# Patient Record
Sex: Female | Born: 2000 | Race: Black or African American | Hispanic: No | Marital: Single | Smoking: Former smoker
Health system: Southern US, Community
[De-identification: ages and names within clinical notes are randomized; demographics above are authoritative.]

## PROBLEM LIST (undated history)

## (undated) DIAGNOSIS — M93003 Unspecified slipped upper femoral epiphysis (nontraumatic), unspecified hip: Secondary | ICD-10-CM

## (undated) DIAGNOSIS — E669 Obesity, unspecified: Secondary | ICD-10-CM

## (undated) DIAGNOSIS — R7303 Prediabetes: Secondary | ICD-10-CM

## (undated) DIAGNOSIS — F819 Developmental disorder of scholastic skills, unspecified: Secondary | ICD-10-CM

## (undated) DIAGNOSIS — F909 Attention-deficit hyperactivity disorder, unspecified type: Secondary | ICD-10-CM

## (undated) DIAGNOSIS — L709 Acne, unspecified: Secondary | ICD-10-CM

## (undated) DIAGNOSIS — H1010 Acute atopic conjunctivitis, unspecified eye: Secondary | ICD-10-CM

## (undated) DIAGNOSIS — J309 Allergic rhinitis, unspecified: Secondary | ICD-10-CM

## (undated) HISTORY — DX: Obesity, unspecified: E66.9

## (undated) HISTORY — DX: Acute atopic conjunctivitis, unspecified eye: H10.10

## (undated) HISTORY — DX: Acne, unspecified: L70.9

## (undated) HISTORY — DX: Unspecified slipped upper femoral epiphysis (nontraumatic), unspecified hip: M93.003

## (undated) HISTORY — DX: Prediabetes: R73.03

## (undated) HISTORY — DX: Allergic rhinitis, unspecified: J30.9

## (undated) HISTORY — DX: Developmental disorder of scholastic skills, unspecified: F81.9

## (undated) HISTORY — DX: Attention-deficit hyperactivity disorder, unspecified type: F90.9

---
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
## 2019-01-05 ENCOUNTER — Telehealth: Payer: Self-pay | Admitting: Pediatrics

## 2019-01-05 NOTE — Telephone Encounter (Signed)

## 2019-01-06 ENCOUNTER — Ambulatory Visit: Payer: No Typology Code available for payment source | Admitting: Family

## 2019-01-13 ENCOUNTER — Ambulatory Visit: Payer: No Typology Code available for payment source | Admitting: Family

## 2019-01-19 ENCOUNTER — Telehealth: Payer: Self-pay | Admitting: Pediatrics

## 2019-01-19 NOTE — Telephone Encounter (Signed)

## 2019-01-20 ENCOUNTER — Encounter: Payer: Self-pay | Admitting: Family

## 2019-01-20 ENCOUNTER — Other Ambulatory Visit (HOSPITAL_COMMUNITY)
Admission: RE | Admit: 2019-01-20 | Discharge: 2019-01-20 | Disposition: A | Discharge status: Home / Self Care | Payer: No Typology Code available for payment source | Source: Ambulatory Visit | Attending: Family | Admitting: Family

## 2019-01-20 ENCOUNTER — Other Ambulatory Visit: Payer: Self-pay

## 2019-01-20 ENCOUNTER — Ambulatory Visit (INDEPENDENT_AMBULATORY_CARE_PROVIDER_SITE_OTHER): Payer: No Typology Code available for payment source | Admitting: Family

## 2019-01-20 VITALS — BP 114/77 | HR 77 | Ht 69.0 in | Wt 137.2 lb

## 2019-01-20 DIAGNOSIS — Z3046 Encounter for surveillance of implantable subdermal contraceptive: Secondary | ICD-10-CM

## 2019-01-20 DIAGNOSIS — Z113 Encounter for screening for infections with a predominantly sexual mode of transmission: Secondary | ICD-10-CM

## 2019-01-20 NOTE — Patient Instructions (Signed)
Schedule one month appointment before you leave clinic today.  Your Nexplanon was removed today and is no longer preventing pregnancy.  If you have sex, remember to use condoms to prevent pregnancy and to prevent sexually transmitted infections.  Leave the outside bandage on for 24 hours.  Leave the smaller bandages on for 3-5 days or until they fall off on their own.  Keep the area clean and dry for 3-5 days.  There is usually bruising or swelling at and around the removal site for a few days to a week after the removal.  If you see redness or pus draining from the removal site, call us immediately.  We would like you to return to the clinic for a follow-up visit in 1 month.  You can call [facility] 24 hours a day with any questions or concerns.  There is always a nurse or doctor available to take your call.  Call 9-1-1 if you have a life-threatening emergency.  For anything else, please call us at [phone] before heading to the ER.

## 2019-01-21 LAB — URINE CYTOLOGY ANCILLARY ONLY
Chlamydia: NEGATIVE
Comment: NEGATIVE
Comment: NEGATIVE
Comment: NORMAL
Neisseria Gonorrhea: NEGATIVE
Trichomonas: NEGATIVE

## 2019-01-29 NOTE — Progress Notes (Signed)
History was provided by the patient.  Angie Lopez is a 18 y.o. female who is here for nexplanon removal.   PCP confirmed? Yes.    Roselind Messier, MD  HPI:   -08/08/2015 nexplanon insertion  -her second implant -had breakthrough bleeding with this one  -elects removal today -declines other methods; denies pregnancy intent  -denies pain with intercourse, pelvic or abdominal pain  -no recent changes in discharge, no lesions   Review of Systems  Constitutional: Negative for chills, fever and malaise/fatigue.  HENT: Negative for sore throat.   Respiratory: Negative for cough and shortness of breath.   Cardiovascular: Negative for chest pain and palpitations.  Gastrointestinal: Negative for abdominal pain.  Genitourinary: Negative for dysuria and frequency.  Musculoskeletal: Negative for joint pain and myalgias.  Skin: Negative for rash.  Psychiatric/Behavioral: The patient is not nervous/anxious.      Patient Active Problem List   Diagnosis Date Noted  . Delusional disorder ([place]) 11/19/2018  . Psychosis ([place]) 11/18/2018  . Failed hearing screening 05/27/2016  . Failed vision screen 05/27/2016  . Adjustment disorder with depressed mood 09/01/2014  . Behavior concern 10/29/2012  . Presence of subdermal contraceptive device 09/22/2012    Current Outpatient Medications on File Prior to Visit  Medication Sig Dispense Refill  . etonogestrel (NEXPLANON) 68 MG IMPL implant Inject 1 each (68 mg total) into the skin once. 1 each 0  . FLUoxetine (PROZAC) 20 MG capsule Take 1 capsule (20 mg total) by mouth daily. (Patient not taking: Reported on 11/17/2018) 30 capsule 3  . Lumateperone Tosylate (CAPLYTA) 42 MG CAPS Take 42 mg by mouth daily. (Patient not taking: Reported on 01/20/2019) 30 capsule 11  . omega-3 acid ethyl esters (LOVAZA) 1 g capsule Take 1 capsule (1 g total) by mouth 2 (two) times daily. (Patient not taking: Reported on 01/20/2019) 60 capsule 5  . Prenatal Vit-Fe  Fumarate-FA (PRENATAL MULTIVITAMIN) TABS tablet Take 1 tablet by mouth daily at 12 noon. (Patient not taking: Reported on 01/20/2019) 90 tablet 1  . temazepam (RESTORIL) 30 MG capsule Take 1 capsule (30 mg total) by mouth at bedtime. 30 capsule 0   No current facility-administered medications on file prior to visit.     No Known Allergies  Physical Exam:    Vitals:   01/20/19 1118  BP: 114/77  Pulse: 77  Weight: 137 lb 3.2 oz (62.2 kg)  Height: 5\' 9"  (1.753 m)    Blood pressure percentiles are not available for patients who are 18 years or older. Patient's last menstrual period was 09/08/2018 (approximate).  Physical Exam HENT:     Head: Normocephalic.  Eyes:     Extraocular Movements: Extraocular movements intact.     Pupils: Pupils are equal, round, and reactive to light.  Cardiovascular:     Rate and Rhythm: Normal rate.  Pulmonary:     Effort: Pulmonary effort is normal.  Musculoskeletal: Normal range of motion.        General: No swelling.  Skin:    General: Skin is warm and dry.     Findings: No rash.  Neurological:     General: No focal deficit present.     Mental Status: She is alert.    Assessment/Plan:  1. Encounter for Nexplanon removal -breakthrough bleeding discussed; she does not want to wait for infection r/o  -of note, prolactin level 44 on 11/19/2018;  Risks & benefits of Nexplanon removal discussed. Consent form signed.  The patient denies any allergies to anesthetics  or antiseptics.  Procedure: Pt was placed in supine position. left arm was flexed at the elbow and externally rotated so that her wrist was parallel to her ear, The device was palpated and marked. The site was cleaned with Betadine. The area surrounding the device was covered with a sterile drape. 1% lidocaine was injected just under the device. A scalpel was used to create a small incision. The device was pushed towards the incision. Fibrous tissue surrounding the device was  gradually removed from the device. The device was removed and measured to ensure all 4 cm of device was removed. Steri-strips were used to close the incision. Pressure dressing was applied to the patient.  The patient was instructed to removed the pressure dressing in 24 hrs.  The patient was advised to move slowly from a supine to an upright position  The patient denied any concerns or complaints  The patient was instructed to schedule a follow-up appt in 1 month. The patient will be called in 1 week to address any concerns.  2. Routine screening for STI (sexually transmitted infection)   -discussed possibility of infection re breakthrough bleeding  -screen per protocol  - GC/Chlamydia  Lab - for urine and other sample types

## 2019-05-27 ENCOUNTER — Emergency Department (HOSPITAL_COMMUNITY)
Admission: EM | Admit: 2019-05-27 | Discharge: 2019-05-31 | Disposition: A | Discharge status: Other Acute Inpatient Hospital | Payer: No Typology Code available for payment source | Attending: Emergency Medicine | Admitting: Emergency Medicine

## 2019-05-27 ENCOUNTER — Encounter (HOSPITAL_COMMUNITY): Payer: Self-pay | Admitting: Emergency Medicine

## 2019-05-27 ENCOUNTER — Other Ambulatory Visit: Payer: Self-pay

## 2019-05-27 DIAGNOSIS — R4689 Other symptoms and signs involving appearance and behavior: Secondary | ICD-10-CM

## 2019-05-27 DIAGNOSIS — Z87891 Personal history of nicotine dependence: Secondary | ICD-10-CM | POA: Diagnosis not present

## 2019-05-27 DIAGNOSIS — Z20822 Contact with and (suspected) exposure to covid-19: Secondary | ICD-10-CM | POA: Insufficient documentation

## 2019-05-27 DIAGNOSIS — F3110 Bipolar disorder, current episode manic without psychotic features, unspecified: Secondary | ICD-10-CM | POA: Diagnosis not present

## 2019-05-27 DIAGNOSIS — F319 Bipolar disorder, unspecified: Secondary | ICD-10-CM | POA: Insufficient documentation

## 2019-05-27 DIAGNOSIS — F29 Unspecified psychosis not due to a substance or known physiological condition: Secondary | ICD-10-CM

## 2019-05-27 DIAGNOSIS — Z79899 Other long term (current) drug therapy: Secondary | ICD-10-CM | POA: Diagnosis not present

## 2019-05-27 DIAGNOSIS — R456 Violent behavior: Secondary | ICD-10-CM | POA: Diagnosis present

## 2019-05-27 DIAGNOSIS — F22 Delusional disorders: Secondary | ICD-10-CM | POA: Insufficient documentation

## 2019-05-27 DIAGNOSIS — Z008 Encounter for other general examination: Secondary | ICD-10-CM

## 2019-05-27 LAB — COMPREHENSIVE METABOLIC PANEL
ALT: 17 U/L (ref 0–44)
AST: 24 U/L (ref 15–41)
Albumin: 4.3 g/dL (ref 3.5–5.0)
Alkaline Phosphatase: 48 U/L (ref 38–126)
Anion gap: 11 (ref 5–15)
BUN: 11 mg/dL (ref 6–20)
CO2: 21 mmol/L — ABNORMAL LOW (ref 22–32)
Calcium: 9.1 mg/dL (ref 8.9–10.3)
Chloride: 107 mmol/L (ref 98–111)
Creatinine, Ser: 0.88 mg/dL (ref 0.44–1.00)
GFR calc Af Amer: >60 mL/min (ref >60)
GFR calc non Af Amer: >60 mL/min (ref >60)
Glucose, Bld: 88 mg/dL (ref 70–99)
Potassium: 3.4 mmol/L — ABNORMAL LOW (ref 3.5–5.1)
Sodium: 139 mmol/L (ref 135–145)
Total Bilirubin: 1.2 mg/dL (ref 0.3–1.2)
Total Protein: 7 g/dL (ref 6.5–8.1)

## 2019-05-27 LAB — CBC
HCT: 36.1 % (ref 36.0–46.0)
Hemoglobin: 12 g/dL (ref 12.0–15.0)
MCH: 29.1 pg (ref 26.0–34.0)
MCHC: 33.2 g/dL (ref 30.0–36.0)
MCV: 87.4 fL (ref 80.0–100.0)
Platelets: 229 10*3/uL (ref 150–400)
RBC: 4.13 MIL/uL (ref 3.87–5.11)
RDW: 12.8 % (ref 11.5–15.5)
WBC: 6.4 10*3/uL (ref 4.0–10.5)
nRBC: 0 % (ref 0.0–0.2)

## 2019-05-27 LAB — ACETAMINOPHEN LEVEL: Acetaminophen (Tylenol), Serum: <10 ug/mL — ABNORMAL LOW (ref 10–30)

## 2019-05-27 LAB — I-STAT BETA HCG BLOOD, ED (MC, WL, AP ONLY): I-stat hCG, quantitative: <5 m[IU]/mL (ref <5)

## 2019-05-27 LAB — SALICYLATE LEVEL: Salicylate Lvl: <7 mg/dL — ABNORMAL LOW (ref 7.0–30.0)

## 2019-05-27 LAB — ETHANOL: Alcohol, Ethyl (B): <10 mg/dL (ref <10)

## 2019-05-27 MED ORDER — ACETAMINOPHEN 325 MG PO TABS: 650.0000 mg | ORAL_TABLET | ORAL | Status: DC | PRN

## 2019-05-27 NOTE — ED Triage Notes (Signed)
Brought in by [employer] under IVC.  Physically combative with [employer], kicking, saying that there is crack in the air and everybody is going to choke on it. IVC paperwork reports, "Respondent is diagnosed as having psychosis. Is not taking her prescribed medication. Is hostile and aggressive towards others. Is having hallucinations claiming her mother has kidnapped her and she saw her mother kill a child etc. Is smoking marijuana daily. Has been committed before. Is a danger to herself and others."

## 2019-05-27 NOTE — Progress Notes (Signed)
Received Angie Lopez at the change of shift asleep in her bed with the sitter at the bedside. She continued to sleep throughout the evening. She woke up and initially calm, but got very agitated and demanding to go home. She was given a snack per her choice. She was playing her TV loudly at 0100 hrs, demanding to close her door and using profanity. She could not follow this writers directions or the sitter.  Security was called to assist with the explanation and hopeful calm her down. She continued to use profanity with threats. She received Geodon 20 mg IM in her right arm. She was calmed within 30 minutes and drifted off to sleep and slept throughout the morning.

## 2019-05-27 NOTE — ED Provider Notes (Addendum)
[facility]-EMERGENCY DEPT Provider Note   CSN: [id number] Arrival date & time: 05/27/19  1737     History Chief Complaint  Patient presents with  . Medical Clearance    Angie Lopez is a 19 y.o. female with past medical history significant for ADHD, delusional disorder, who presents for evaluation under IVC [employer].  Angie Lopez [employer] patient has been physically aggressive, reaching for things in the air because she states "there is a crack in the air and your gonna choke on it."   Per IVC paperwork  "Respondent is diagnosed as having psychosis. Is not taking her prescribed medication. Is hostile and aggressive towards others. Is having hallucinations claiming her mother has kidnapped her and she saw her mother kill a child etc. Is smoking marijuana daily. Has been committed before. Is a danger to herself and others."   Patient unwilling to answer most questions during exam. States "I dont know what the Fu**." Sitting in bed. Rocking back and forth.    Patient actively picking out her hair on evaluation. Pile of hair on her triage bed.  Under IVC by family  HPI     Past Medical History:  Diagnosis Date  . Acne 08/2011   epiduo 2012  . ADHD (attention deficit hyperactivity disorder) 2009   HTN on stimulants, off meds 03/2012  . Allergic conjunctivitis 2012  . Allergic rhinitis 08/2010  . Learning disorder 08/2010   borderline IQ, had IEP in 2012  . Obesity 08/13/2010  . Prediabetes    HBA1C 6.0 03/2010, never took metromin in 2012-2014  . SCFE (slipped capital femoral epiphysis) 2011    Patient Active Problem List   Diagnosis Date Noted  . Delusional disorder (HCC) 11/19/2018  . Psychosis (HCC) 11/18/2018  . Failed hearing screening 05/27/2016  . Failed vision screen 05/27/2016  . Adjustment disorder with depressed mood 09/01/2014  . Behavior concern 10/29/2012  . Presence of subdermal contraceptive device 09/22/2012    Past Surgical History:  Procedure  Laterality Date  . HIP PINNING  12/2009     OB History   No obstetric history on file.     Family History  Problem Relation Age of Onset  . Obesity Sister   . Obesity Brother   . Hypertension Maternal Grandmother   . Diabetes Maternal Grandmother   . Obesity Maternal Grandmother   . Hypertension Maternal Grandfather   . Obesity Mother   . Diabetes Maternal Aunt   . Obesity Maternal Aunt   . Diabetes Cousin   . Thyroid disease Neg Hx     Social History   Tobacco Use  . Smoking status: Former Smoker    Types: Cigarettes  . Smokeless tobacco: Never Used  Substance Use Topics  . Alcohol use: Not on file  . Drug use: Yes    Types: Marijuana, Cocaine    Home Medications Prior to Admission medications   Medication Sig Start Date End Date Taking? Authorizing Provider  FLUoxetine (PROZAC) 20 MG capsule Take 1 capsule (20 mg total) by mouth daily. Patient not taking: Reported on 11/17/2018 04/15/17   Verneda Skill, FNP  Lumateperone Tosylate (CAPLYTA) 42 MG CAPS Take 42 mg by mouth daily. Patient not taking: Reported on 01/20/2019 11/22/18   Malvin Johns, MD  omega-3 acid ethyl esters (LOVAZA) 1 g capsule Take 1 capsule (1 g total) by mouth 2 (two) times daily. Patient not taking: Reported on 01/20/2019 11/22/18   Malvin Johns, MD  Prenatal Vit-Fe Fumarate-FA (PRENATAL MULTIVITAMIN) TABS  tablet Take 1 tablet by mouth daily at 12 noon. Patient not taking: Reported on 01/20/2019 11/22/18   Johnn Hai, MD  temazepam (RESTORIL) 30 MG capsule Take 1 capsule (30 mg total) by mouth at bedtime. Patient not taking: Reported on 05/27/2019 11/22/18   Johnn Hai, MD    Allergies    Patient has no known allergies.  Review of Systems   Review of Systems  Constitutional: Negative.   HENT: Negative.   Respiratory: Negative.   Cardiovascular: Negative.   Gastrointestinal: Negative.   Genitourinary: Negative.   Musculoskeletal: Negative.   Skin: Negative.   Psychiatric/Behavioral:  Positive for agitation, behavioral problems and hallucinations. The patient is hyperactive.   All other systems reviewed and are negative.   Physical Exam Updated Vital Signs BP 110/67 (BP Location: Left Arm)   Pulse (!) 59   Temp 98.2 F (36.8 C) (Oral)   Resp 16   SpO2 100%   Physical Exam Vitals and nursing note reviewed.  Constitutional:      General: She is not in acute distress.    Appearance: She is well-developed. She is not ill-appearing, toxic-appearing or diaphoretic.  HENT:     Head: Normocephalic and atraumatic.     Nose: Nose normal.     Mouth/Throat:     Mouth: Mucous membranes are moist.  Eyes:     Pupils: Pupils are equal, round, and reactive to light.  Cardiovascular:     Rate and Rhythm: Normal rate.     Pulses: Normal pulses.     Heart sounds: Normal heart sounds.  Pulmonary:     Effort: Pulmonary effort is normal. No respiratory distress.     Breath sounds: Normal breath sounds.  Abdominal:     General: Bowel sounds are normal. There is no distension.  Musculoskeletal:        General: Normal range of motion.     Cervical back: Normal range of motion.  Skin:    General: Skin is warm and dry.  Neurological:     Mental Status: She is alert.     Comments: Ambulatory without difficulty  Psychiatric:     Comments: Refuses to answer SI, HI however admits to auditory hallucinations. Appears withdrawn. Intermittently rocks back and forth in bed. Refuses to answer most questions. Admits "seeing things in the air." Picking out hair on exam.    ED Results / Procedures / Treatments   Labs (all labs ordered are listed, but only abnormal results are displayed) Labs Reviewed  COMPREHENSIVE METABOLIC PANEL - Abnormal; Notable for the following components:      Result Value   Potassium 3.4 (*)    CO2 21 (*)    All other components within normal limits  SALICYLATE LEVEL - Abnormal; Notable for the following components:   Salicylate Lvl <3.9 (*)    All other  components within normal limits  ACETAMINOPHEN LEVEL - Abnormal; Notable for the following components:   Acetaminophen (Tylenol), Serum <10 (*)    All other components within normal limits  RESPIRATORY PANEL BY RT PCR (FLU A&B, COVID)  ETHANOL  CBC  RAPID URINE DRUG SCREEN, HOSP PERFORMED  I-STAT BETA HCG BLOOD, ED ([facility], [facility], [facility] ONLY)    EKG None  Radiology No results found.  Procedures Procedures (including critical care time)  Medications Ordered in ED Medications  acetaminophen (TYLENOL) tablet 650 mg (has no administration in time range)    ED Course  I have reviewed the triage vital signs and the nursing  notes.  Pertinent labs & imaging results that were available during my care of the patient were reviewed by me and considered in my medical decision making (see chart for details).  19 year old presents under IVC from family. Family states patient not compliant with medications. She is physically aggressive with others. Apparently has been hallucinating at home and is physically trying to harm others. According to family patient admits to attempt at self harm.  Plan med clearance labs and TTS consult. Currently under IVC.  Patient medically cleared. Psych hold orders placed. Disposition per TTS    MDM Rules/Calculators/A&P                       Final Clinical Impression(s) / ED Diagnoses Final diagnoses:  Medical clearance for psychiatric admission  Aggressive behavior  Psychosis, unspecified psychosis type [facility])    Rx / DC Orders ED Discharge Orders    None       Crissie Aloi A, PA-C 05/27/19 2035    Avon Mergenthaler A, PA-C 05/27/19 2036    Benjiman Core, MD 05/28/19 [phone]

## 2019-05-28 LAB — RAPID URINE DRUG SCREEN, HOSP PERFORMED
Amphetamines: NOT DETECTED
Barbiturates: NOT DETECTED
Benzodiazepines: NOT DETECTED
Cocaine: NOT DETECTED
Opiates: NOT DETECTED
Tetrahydrocannabinol: POSITIVE — AB

## 2019-05-28 LAB — RESPIRATORY PANEL BY RT PCR (FLU A&B, COVID)
Influenza A by PCR: NEGATIVE
Influenza B by PCR: NEGATIVE
SARS Coronavirus 2 by RT PCR: NEGATIVE

## 2019-05-28 MED ORDER — ZIPRASIDONE MESYLATE 20 MG IM SOLR
20.0000 mg | Freq: Once | INTRAMUSCULAR | Status: AC
  Administered 2019-05-28: 20 mg via INTRAMUSCULAR
  Filled 2019-05-28: qty 20

## 2019-05-28 MED ORDER — FLUOXETINE HCL 20 MG PO CAPS
20.0000 mg | ORAL_CAPSULE | Freq: Every day | ORAL | Status: DC
  Filled 2019-05-28 (×4): qty 1

## 2019-05-28 MED ORDER — STERILE WATER FOR INJECTION IJ SOLN
INTRAMUSCULAR | Status: AC
  Administered 2019-05-28: 02:00:00 10 mL
  Filled 2019-05-28: qty 10

## 2019-05-28 NOTE — ED Notes (Signed)
Pt awake, eating breakfast- encouragement and support provided- not open to communication with this nurse, argumentative. Sitter present. Wil continue to monitor.

## 2019-05-28 NOTE — ED Provider Notes (Addendum)
TTS consult appreciated,patient meets inpatient criteria, but no bed available. They will work on placement.   Dione Booze, MD 05/28/19 0990    Dione Booze, MD 05/28/19 [phone]  Patient now agitated and combative, need sedation for her protection and protection of staff.  She is given a dose of ziprasidone.  Following above-noted treatment, patient is sleeping  CRITICAL CARE Performed by: Dione Booze Total critical care time: 35 minutes Critical care time was exclusive of separately billable procedures and treating other patients. Critical care was necessary to treat or prevent imminent or life-threatening deterioration. Critical care was time spent personally by me on the following activities: development of treatment plan with patient and/or surrogate as well as nursing, discussions with consultants, evaluation of patient's response to treatment, examination of patient, obtaining history from patient or surrogate, ordering and performing treatments and interventions, ordering and review of laboratory studies, ordering and review of radiographic studies, pulse oximetry and re-evaluation of patient's condition.    Dione Booze, MD 05/28/19 [phone]

## 2019-05-28 NOTE — BHH Counselor (Addendum)
Pt's mother [name], [phone]) would like to be notified if the pt is discharged from the ED.   Pt's mother also would like to an update on pt's disposition. Clinician expressed the pt has to consent before information is given. Discussed with Randa Evens, RN.    Redmond Pulling, MS, [facility], [facility] Triage Specialist [phone].

## 2019-05-28 NOTE — Progress Notes (Addendum)
Patient meets criteria for inpatient treatment per Nira Conn, NP. No appropriate beds at [facility] currently. CSW faxed referrals to the following facilities for review:  [facility]   [facility]   [facility]   [facility]   [facility]   [facility]   [facility]   [facility]   [facility]   [facility]   [facility]    [facility]  TTS will continue to seek bed placement.     Ruthann Cancer MSW, [facility] Clincal Social Worker Disposition  [facility] Ph: [phone] Fax: [phone] 05/28/2019 11:07 AM

## 2019-05-28 NOTE — ED Notes (Addendum)
Pt has become very agitated attempted to perform the ekg. Say the she would shoot me in the face and that no one is going to touch her. Will try to calm pt and capture ekg later.

## 2019-05-28 NOTE — Progress Notes (Signed)
Pt denied placement at [facility] due to no current bed availability.   Travon Crochet MSW, LCSWA Clincal Social Worker Disposition  [facility] Ph: [phone] Fax: [phone]  

## 2019-05-28 NOTE — Progress Notes (Signed)
Received Angie Lopez at the change of shift awake in her room watching the cartoons on the TV. She got very upset with the technician who asked her to cooperative with the EKG. She started cursing him and making verbal threats. The sitter remains at the bedside. She refused to take her Prozac, delusional and stated I am not her nurse, nothing is wrong with her and I cannot diagnosis her. She called her father and asked him to come and pick her up, he refused and asked her to stay at the hospital for help.  She is sleeping at 2 hrs interval, woke up and requested a snack. Her request was granted and after 2 hours she drifted back to sleep.

## 2019-05-28 NOTE — Consult Note (Addendum)
Chart reviewed and collateral information received from nursing staff.  Patient is sleep and unable to participate in assessment.  Based on notes, she continues to meet inpatient criteria and SW has already faxed her out for placement.   Recommend EKG for baseline assessment; pending results patient can be restarted on home medications, antipsychotics.   Medications:  Caplyta 42mg  capsules po daily for psychosis Fluoxetine 20mg  po daily for depression

## 2019-05-28 NOTE — ED Provider Notes (Signed)
The patient has been placed in psychiatric observation due to the need to provide a safe environment for the patient while obtaining psychiatric consultation and evaluation, as well as ongoing medical and medication management to treat the patient's condition.  The patient has been placed under full IVC at this time.   Fredie Majano, Barbara Cower, MD 05/28/19 [phone]

## 2019-05-28 NOTE — ED Notes (Signed)
Pt refused to take shower at this time RN notify

## 2019-05-28 NOTE — BH Assessment (Addendum)
Tele Assessment Note   Patient Name: Angie Lopez MRN: [id number] Referring Physician: Nettie Elm, PA-C. Location of Patient: [facility], [phone]. Location of Provider: [place]  Sandeep Iacovelli is an 19 y.o. female, who presents involuntary and unaccompanied to [facility]. Clinician provided her name, position and location to introduce herself to the pt. Clinician asked the pt, "what brought you to the hospital?" Pt reported, "an argument." Pt reported, a stranger was in her house, she asked the stranger to call the police, when the police arrived they brought her to the hospital. Pt reported, the stranger wanted her to hurt them. Clinician asked the pt if she has suicidal thoughts. Pt replied, "no, I got money." Clinician asked the pt if she had homicidal thoughts. Pt replied, "I know myself, I'm not weak, I could of murked them." Clinician asked the pt if she had access to weapons. Pt became upset then asked, "do you, do you, what if a zombie came or someone broke in you house; would you have a weapon?" Pt also asked, "who are you?" Clinician reintroduced herself. Pt reported, "how are you a counselor?" Pt then asked clinician to hang up.   Pt was IVC'd by her mother [name], [phone]). Per IVC paperwork: "Respondent is diagnosed as having psychosis. Is not taking her prescribed medications. Is hostile and aggressive towards others. Is having hallucinations claming herm other had kidnapped her and she saw her mother kill a child etc. Is smoking marijuana daily. 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 Is a danger to herself and others."   Clinician called pt's mother to gather collateral information. Per mother, the pt was discharged from [facility] last year but did not follow up with taking medications. Pt's mother reported, the pt stays in her room all day everyday, she comes out to get food and goes back in her room. Pt's mother reported, the pt does not got outside or have  any friends. Per mother, today before coming home she went to the hair store to buy the pt hair (she like to braid her hair). Pt's mother reported, the pt started working on her hair and she asked the pt if she was okay. Per mother, the pt stood in her doorway and started cursing at her, calling her names. Pt's mother reported, she called the police, when they arrived they brought a therapist with them and recommended she fill out IVC paperwork. Per mother, the pt fought the police and was screaming at the top of her lungs. Pt's mother reported, the pt said she killed her boyfriends grandson, on Thursday she text her father back to back with paranoid delusions.Pt's mother reported, recently she can not calm the pt. Pt's mother reported, she lost the pt's prescription during a move but the pt did not take the samples. Pt's mother reported, last week she called [facility] to scheduled the pt a medication management but was unable to because the pt is over 18. Pt's mother there are kitchen knives in the home. Pt's mother does not feel the pt will be safe if discharged from the ED.   Pt has a previous inpatient admission to [facility] on 11/18/2018-11/22/2018.  Pt presents alert in scrubs with argumentative, aggressive speech. Pt's eye contact was poor. Pt's mood was irritable. Pt's affect was irritable, angry. Pt's judgement was impaired. Pt's concentration, insight and impulse control are poor.   Diagnosis: Delusional disorder [facility])  Past Medical History:  Past Medical History:  Diagnosis Date  .  Acne 08/2011   epiduo 2012  . ADHD (attention deficit hyperactivity disorder) 2009   HTN on stimulants, off meds 03/2012  . Allergic conjunctivitis 2012  . Allergic rhinitis 08/2010  . Learning disorder 08/2010   borderline IQ, had IEP in 2012  . Obesity 08/13/2010  . Prediabetes    HBA1C 6.0 03/2010, never took metromin in 2012-2014  . SCFE (slipped capital femoral epiphysis) 2011    Past Surgical History:   Procedure Laterality Date  . HIP PINNING  12/2009    Family History:  Family History  Problem Relation Age of Onset  . Obesity Sister   . Obesity Brother   . Hypertension Maternal Grandmother   . Diabetes Maternal Grandmother   . Obesity Maternal Grandmother   . Hypertension Maternal Grandfather   . Obesity Mother   . Diabetes Maternal Aunt   . Obesity Maternal Aunt   . Diabetes Cousin   . Thyroid disease Neg Hx     Social History:  reports that she has quit smoking. Her smoking use included cigarettes. She has never used smokeless tobacco. She reports current drug use. Drugs: Marijuana and Cocaine. No history on file for alcohol.  Additional Social History:  Alcohol / Drug Use Pain Medications: See MAR Prescriptions: See MAR Over the Counter: See MAR  CIWA: CIWA-Ar BP: 98/71 Pulse Rate: 60 COWS:    Allergies: No Known Allergies  Home Medications: (Not in a hospital admission)   OB/GYN Status:  No LMP recorded. Patient has had an implant.  General Assessment Data [facility] of [facility]: [facility] TTS Assessment: In system Is this a Tele or Face-to-Face Assessment?: Tele Assessment Is this an Initial Assessment or a Re-assessment for this encounter?: Initial Assessment Patient Accompanied by:: N/A Language Other than English: No Living Arrangements: Other (Comment)(Mother. ) What gender do you identify as?: Female Marital status: Single Living Arrangements: Parent Can pt return to current living arrangement?: (Mom wants the pt to get some help. ) Admission Status: Involuntary Petitioner: Family member(Leslie Robsinson, mother, [phone].) Is patient capable of signing voluntary admission?: No Referral Source: Other([employer]) Insurance type: [facility] [employer].     Crisis Care Plan Living Arrangements: Parent Legal Guardian: Other:(Self. ) Name of Psychiatrist: NA Name of Therapist: NA  Education Status Is patient currently in school?: No Is the patient  employed, unemployed or receiving disability?: Unemployed  Risk to self with the past 6 months Suicidal Ideation: No(Pt denies. ) Has patient been a risk to self within the past 6 months prior to admission? : (UTA) Suicidal Intent: No Has patient had any suicidal intent within the past 6 months prior to admission? : (UTA) Is patient at risk for suicide?: No Suicidal Plan?: No Has patient had any suicidal plan within the past 6 months prior to admission? : No Access to Means: (UTA) What has been your use of drugs/alcohol within the last 12 months?: UDS is pending.  Previous Attempts/Gestures: No(Pt denies. ) How many times?: 0 Other Self Harm Risks: UTA Triggers for Past Attempts: None known Intentional Self Injurious Behavior: (UTA) Family Suicide History: Unable to assess Recent stressful life event(s): (UTA) Persecutory voices/beliefs?: Yes Depression: (UTA) Substance abuse history and/or treatment for substance abuse?: (UTA) Suicide prevention information given to non-admitted patients: Not applicable  Risk to Others within the past 6 months Homicidal Ideation: (UTA) Does patient have any lifetime risk of violence toward others beyond the six months prior to admission? : Yes (comment)(Per mother, pt fought the police tonight. ) Thoughts  of Harm to Others: (UTA) Current Homicidal Intent: (UTA) Current Homicidal Plan: (UTA) Access to Homicidal Means: (UTA) Identified Victim: UTA History of harm to others?: Yes Assessment of Violence: On admission Violent Behavior Description: Per mother, pt fought the police tonight.  Does patient have access to weapons?: (UTA) Criminal Charges Pending?: (UTA) Does patient have a court date: (UTA) Is patient on probation?: (UTA)  Psychosis Hallucinations: (UTA) Delusions: Persecutory  Mental Status Report Appearance/Hygiene: In scrubs Eye Contact: Poor Motor Activity: Unremarkable Speech: Argumentative, Aggressive Level of  Consciousness: Alert Mood: Irritable Affect: Irritable, Angry Anxiety Level: Moderate Thought Processes: Unable to Assess Judgement: Impaired Orientation: Person, Place, Time Obsessive Compulsive Thoughts/Behaviors: Minimal  Cognitive Functioning Concentration: Poor Memory: Recent Impaired Is patient IDD: No Insight: Poor Impulse Control: Poor Appetite: (UTA) Have you had any weight changes? : (UTA) Sleep: Unable to Assess Total Hours of Sleep: (UTA) Vegetative Symptoms: Unable to Assess  ADLScreening [facility] Assessment Services) Patient's cognitive ability adequate to safely complete daily activities?: Yes Patient able to express need for assistance with ADLs?: Yes Independently performs ADLs?: Yes (appropriate for developmental age)  Prior Inpatient Therapy Prior Inpatient Therapy: Yes Prior Therapy Dates: 11/2018 Prior Therapy Facilty/Provider(s): [facility].  Reason for Treatment: Psychosis.   Prior Outpatient Therapy Prior Outpatient Therapy: No Does patient have an ACCT team?: No Does patient have Intensive In-House Services?  : No Does patient have [facility] services? : No Does patient have P4CC services?: No  ADL Screening (condition at time of admission) Patient's cognitive ability adequate to safely complete daily activities?: Yes Is the patient deaf or have difficulty hearing?: No Does the patient have difficulty seeing, even when wearing glasses/contacts?: No Does the patient have difficulty concentrating, remembering, or making decisions?: Yes Patient able to express need for assistance with ADLs?: Yes Does the patient have difficulty dressing or bathing?: No Independently performs ADLs?: Yes (appropriate for developmental age) Does the patient have difficulty walking or climbing stairs?: No Weakness of Legs: (UTA) Weakness of Arms/Hands: (UTA)  Home Assistive Devices/Equipment Home Assistive Devices/Equipment: (UTA)    Abuse/Neglect Assessment (Assessment  to be complete while patient is alone) Abuse/Neglect Assessment Can Be Completed: Unable to assess, patient is non-responsive or altered mental status     Advance Directives (For Healthcare) Does Patient Have a Medical Advance Directive?: Unable to assess, patient is non-responsive or altered mental status Would patient like information on creating a medical advance directive?: No - Patient declined          Disposition: Nira Conn, NP inpatient treatment. Per Hassie Bruce, RN no appropriate beds available. TTS to seek placement. Disposition discussed with Dr. Preston Fleeting and Randa Evens, RN.   Disposition Initial Assessment Completed for this Encounter: Yes  This service was provided via telemedicine using a 2-way, interactive audio and video technology.  Names of all persons participating in this telemedicine service and their role in this encounter. Name: Maguadalupe Lata. Role: Patient.  Name: Redmond Pulling, MS, [facility], [facility]. Role: Counselor.   Name: [name]. Role: Mother.       Redmond Pulling 05/28/2019 1:47 AM     Redmond Pulling, MS, [facility], [facility] Triage Specialist [phone]

## 2019-05-29 DIAGNOSIS — F3110 Bipolar disorder, current episode manic without psychotic features, unspecified: Secondary | ICD-10-CM

## 2019-05-29 MED ORDER — HALOPERIDOL LACTATE 5 MG/ML IJ SOLN: 5.0000 mg | Freq: Four times a day (QID) | INTRAMUSCULAR | Status: DC | PRN

## 2019-05-29 MED ORDER — RISPERIDONE 1 MG PO TBDP
2.0000 mg | ORAL_TABLET | Freq: Two times a day (BID) | ORAL | Status: DC
  Filled 2019-05-29 (×2): qty 2

## 2019-05-29 MED ORDER — HYDROXYZINE HCL 25 MG PO TABS: 25.0000 mg | ORAL_TABLET | Freq: Three times a day (TID) | ORAL | Status: DC | PRN

## 2019-05-29 MED ORDER — HALOPERIDOL 5 MG PO TABS: 5.0000 mg | ORAL_TABLET | Freq: Four times a day (QID) | ORAL | Status: DC | PRN

## 2019-05-29 NOTE — Progress Notes (Signed)
Per Kim with admissions at [facility] pt was denied placement   due to not meeting criteria.  Maddux Vanscyoc MSW, LCSWA Clincal Social Worker Disposition  [facility] Ph: [phone] Fax: [phone]  

## 2019-05-29 NOTE — Progress Notes (Addendum)
Pt continues to meet inpatient criteria per Ophelia Shoulder, NP. CSW faxed referrals to the following hospitals:    [facility]   [facility]  [facility]  [facility]   [facility]   [facility]   [facility]  Ruthann Cancer MSW, LCSWA Clincal Social Worker Disposition  [facility] Ph: [phone] Fax: [phone]

## 2019-05-29 NOTE — Progress Notes (Signed)
Received Angie Lopez this PM in her room awake in bed with the sitter at the bedside.She refused her medication and VS checks throughout the shift.

## 2019-05-29 NOTE — ED Notes (Signed)
This Clinical research associate took the EKG machine into pt's room to attempt compliance with EKG. Pt refused.  She said, "There is nothing wrong with me. You are keeping me here against my will. I do not know why I am here. For all I know this might be the Zombie apocolypse and I might need a gun.  I have things that I have to pay for and I need to leave."

## 2019-05-29 NOTE — ED Notes (Signed)
Pt refused her medication and said, "I don't need that; I'm healthy.

## 2019-05-29 NOTE — Consult Note (Signed)
[facility] Psych ED Progress Note  05/29/2019 11:23 AM Angie Lopez  MRN:  [id number] Subjective:  "I do not need to be here.  I think this is a set-up". Principal Problem: Bipolar I disorder with mania (HCC) Diagnosis:  Principal Problem:   Bipolar I disorder with mania (HCC)  Total Time spent with patient: 15 minutes   Patient seen for face to face evaluation via telepsych and reviewed with Dr. Jama Flavors on 05/29/2019.  She presents guarded, paranoid, delusional, and uncooperative with progressing agitation during the assessment.  She states, "you are not my doctor. My doctor is white. I do not need to be here.  I think this is a set-up".  Her speech is pressured, she asks questions but does not allow time to receive a response.  She is tangential and does not elaborate on why she feels this way but clearly remains agitated with limited provocation and would benefit from inpatient treatment.  She states she does need to be in the hospital and states she is ready to go home.    Per nursing, patient refused her prozac citing she does not need the medication.    Past Psychiatric History: as outlined below  Past Medical History:  Past Medical History:  Diagnosis Date  . Acne 08/2011   epiduo 2012  . ADHD (attention deficit hyperactivity disorder) 2009   HTN on stimulants, off meds 03/2012  . Allergic conjunctivitis 2012  . Allergic rhinitis 08/2010  . Learning disorder 08/2010   borderline IQ, had IEP in 2012  . Obesity 08/13/2010  . Prediabetes    HBA1C 6.0 03/2010, never took metromin in 2012-2014  . SCFE (slipped capital femoral epiphysis) 2011    Past Surgical History:  Procedure Laterality Date  . HIP PINNING  12/2009   Family History:  Family History  Problem Relation Age of Onset  . Obesity Sister   . Obesity Brother   . Hypertension Maternal Grandmother   . Diabetes Maternal Grandmother   . Obesity Maternal Grandmother   . Hypertension Maternal Grandfather   . Obesity Mother   .  Diabetes Maternal Aunt   . Obesity Maternal Aunt   . Diabetes Cousin   . Thyroid disease Neg Hx    Family Psychiatric  History: unknown Social History:  Social History   Substance and Sexual Activity  Alcohol Use None     Social History   Substance and Sexual Activity  Drug Use Yes  . Types: Marijuana, Cocaine    Social History   Socioeconomic History  . Marital status: Single    Spouse name: Not on file  . Number of children: Not on file  . Years of education: Not on file  . Highest education level: Not on file  Occupational History  . Not on file  Tobacco Use  . Smoking status: Former Smoker    Types: Cigarettes  . Smokeless tobacco: Never Used  Substance and Sexual Activity  . Alcohol use: Not on file  . Drug use: Yes    Types: Marijuana, Cocaine  . Sexual activity: Yes    Birth control/protection: Implant  Other Topics Concern  . Not on file  Social History Narrative   Lives at home with mom and sister, attends [employer] is in 7th grade. [name].    Social Determinants of Health   Financial Resource Strain:   . Difficulty of Paying Living Expenses:   Food Insecurity:   . Worried About Programme researcher, broadcasting/film/video in the Last  Year:   . Ran Out of Food in the Last Year:   Transportation Needs:   . Film/video editor (Medical):   Marland Kitchen Lack of Transportation (Non-Medical):   Physical Activity:   . Days of Exercise per Week:   . Minutes of Exercise per Session:   Stress:   . Feeling of Stress :   Social Connections:   . Frequency of Communication with Friends and Family:   . Frequency of Social Gatherings with Friends and Family:   . Attends Religious Services:   . Active Member of Clubs or Organizations:   . Attends Archivist Meetings:   Marland Kitchen Marital Status:     Sleep: Fair  Appetite:  Fair  Current Medications: Current Facility-Administered Medications  Medication Dose Route Frequency Provider Last Rate Last Admin  . acetaminophen  (TYLENOL) tablet 650 mg  650 mg Oral Q4H PRN Henderly, Britni A, PA-C      . FLUoxetine (PROZAC) capsule 20 mg  20 mg Oral Daily Merlyn Lot E, NP      . haloperidol (HALDOL) tablet 5 mg  5 mg Oral Q6H PRN Merlyn Lot E, NP       Or  . haloperidol lactate (HALDOL) injection 5 mg  5 mg Intramuscular Q6H PRN Merlyn Lot E, NP      . risperiDONE (RISPERDAL M-TABS) disintegrating tablet 2 mg  2 mg Oral BID Mallie Darting, NP       Current Outpatient Medications  Medication Sig Dispense Refill  . FLUoxetine (PROZAC) 20 MG capsule Take 1 capsule (20 mg total) by mouth daily. (Patient not taking: Reported on 11/17/2018) 30 capsule 3  . Lumateperone Tosylate (CAPLYTA) 42 MG CAPS Take 42 mg by mouth daily. (Patient not taking: Reported on 01/20/2019) 30 capsule 11  . omega-3 acid ethyl esters (LOVAZA) 1 g capsule Take 1 capsule (1 g total) by mouth 2 (two) times daily. (Patient not taking: Reported on 01/20/2019) 60 capsule 5  . Prenatal Vit-Fe Fumarate-FA (PRENATAL MULTIVITAMIN) TABS tablet Take 1 tablet by mouth daily at 12 noon. (Patient not taking: Reported on 01/20/2019) 90 tablet 1  . temazepam (RESTORIL) 30 MG capsule Take 1 capsule (30 mg total) by mouth at bedtime. (Patient not taking: Reported on 05/27/2019) 30 capsule 0    Lab Results:  Results for orders placed or performed during the hospital encounter of 05/27/19 (from the past 48 hour(s))  Comprehensive metabolic panel     Status: Abnormal   Collection Time: 05/27/19  6:20 PM  Result Value Ref Range   Sodium 139 135 - 145 mmol/L   Potassium 3.4 (L) 3.5 - 5.1 mmol/L   Chloride 107 98 - 111 mmol/L   CO2 21 (L) 22 - 32 mmol/L   Glucose, Bld 88 70 - 99 mg/dL    Comment: Glucose reference range applies only to samples taken after fasting for at least 8 hours.   BUN 11 6 - 20 mg/dL   Creatinine, Ser 0.88 0.44 - 1.00 mg/dL   Calcium 9.1 8.9 - 10.3 mg/dL   Total Protein 7.0 6.5 - 8.1 g/dL   Albumin 4.3 3.5 - 5.0 g/dL   AST 24 15 -  41 U/L   ALT 17 0 - 44 U/L   Alkaline Phosphatase 48 38 - 126 U/L   Total Bilirubin 1.2 0.3 - 1.2 mg/dL   GFR calc non Af Amer >60 >60 mL/min   GFR calc Af Amer >60 >60 mL/min   Anion gap 11  5 - 15    Comment: Performed at [facility], [address] [place], [place] [address]  Ethanol     Status: None   Collection Time: 05/27/19  6:20 PM  Result Value Ref Range   Alcohol, Ethyl (B) <10 <10 mg/dL    Comment: (NOTE) Lowest detectable limit for serum alcohol is 10 mg/dL. For medical purposes only. Performed at [facility], [address] [place], [place] [address]   Salicylate level     Status: Abnormal   Collection Time: 05/27/19  6:20 PM  Result Value Ref Range   Salicylate Lvl <7.0 (L) 7.0 - 30.0 mg/dL    Comment: Performed at [facility], [address] [place], [place] [address]  Acetaminophen level     Status: Abnormal   Collection Time: 05/27/19  6:20 PM  Result Value Ref Range   Acetaminophen (Tylenol), Serum <10 (L) 10 - 30 ug/mL    Comment: (NOTE) Therapeutic concentrations vary significantly. A range of 10-30 ug/mL  may be an effective concentration for many patients. However, some  are best treated at concentrations outside of this range. Acetaminophen concentrations >150 ug/mL at 4 hours after ingestion  and >50 ug/mL at 12 hours after ingestion are often associated with  toxic reactions. Performed at [facility], [address] [place], [place] [address]   cbc     Status: None   Collection Time: 05/27/19  6:20 PM  Result Value Ref Range   WBC 6.4 4.0 - 10.5 K/uL   RBC 4.13 3.87 - 5.11 MIL/uL   Hemoglobin 12.0 12.0 - 15.0 g/dL   HCT 96.236.1 95.236.0 - 84.146.0 %   MCV 87.4 80.0 - 100.0 fL   MCH 29.1 26.0 - 34.0 pg   MCHC 33.2 30.0 - 36.0 g/dL   RDW 32.412.8 40.111.5 - 02.715.5 %   Platelets 229 150 - 400 K/uL   nRBC 0.0 0.0 - 0.2 %    Comment: Performed at [facility], [address] [place], [place] [address]  I-Stat beta hCG blood, ED     Status: None   Collection Time: 05/27/19  6:23 PM  Result Value Ref Range   I-stat hCG, quantitative <5.0 <5 mIU/mL   Comment 3            Comment:   GEST. AGE      CONC.  (mIU/mL)   <=1 WEEK        5 - 50     2 WEEKS       50 - 500     3 WEEKS       100 - 10,000     4 WEEKS     1,000 - 30,000        FEMALE AND NON-PREGNANT FEMALE:     LESS THAN 5 mIU/mL   Respiratory Panel by RT PCR (Flu A&B, Covid) - Nasopharyngeal Swab     Status: None   Collection Time: 05/27/19  7:48 PM   Specimen: Nasopharyngeal Swab  Result Value Ref Range   SARS Coronavirus 2 by RT PCR NEGATIVE NEGATIVE    Comment: (NOTE) SARS-CoV-2 target nucleic acids are NOT DETECTED. The SARS-CoV-2 RNA is generally detectable in upper respiratoy specimens during the acute phase of infection. The lowest concentration of SARS-CoV-2 viral copies this assay can detect is 131 copies/mL. A negative result does not preclude SARS-Cov-2 infection and should not be used as the sole basis for  treatment or other patient management decisions. A negative result may occur with  improper specimen collection/handling, submission of specimen other than nasopharyngeal swab, presence of viral mutation(s) within the areas targeted by this assay, and inadequate number of viral copies (<131 copies/mL). A negative result must be combined with clinical observations, patient history, and epidemiological information. The expected result is Negative. Fact Sheet for Patients:  https://www.moore.com/ Fact Sheet for Healthcare Providers:  https://www.young.biz/ This test is not yet ap proved or cleared by the [place] FDA and  has been authorized for detection and/or diagnosis of SARS-CoV-2 by FDA under an Emergency Use Authorization (EUA). This EUA will remain  in effect (meaning this test can be used) for the duration of the COVID-19  declaration under Section 564(b)(1) of the Act, 21 U.S.C. section 360bbb-3(b)(1), unless the authorization is terminated or revoked sooner.    Influenza A by PCR NEGATIVE NEGATIVE   Influenza B by PCR NEGATIVE NEGATIVE    Comment: (NOTE) The Xpert Xpress SARS-CoV-2/FLU/RSV assay is intended as an aid in  the diagnosis of influenza from Nasopharyngeal swab specimens and  should not be used as a sole basis for treatment. Nasal washings and  aspirates are unacceptable for Xpert Xpress SARS-CoV-2/FLU/RSV  testing. Fact Sheet for Patients: https://www.moore.com/ Fact Sheet for Healthcare Providers: https://www.young.biz/ This test is not yet approved or cleared by the [place] FDA and  has been authorized for detection and/or diagnosis of SARS-CoV-2 by  FDA under an Emergency Use Authorization (EUA). This EUA will remain  in effect (meaning this test can be used) for the duration of the  Covid-19 declaration under Section 564(b)(1) of the Act, 21  U.S.C. section 360bbb-3(b)(1), unless the authorization is  terminated or revoked. Performed at [facility], [address] [place], [place] [address]   Rapid urine drug screen (hospital performed)     Status: Abnormal   Collection Time: 05/27/19 11:50 PM  Result Value Ref Range   Opiates NONE DETECTED NONE DETECTED   Cocaine NONE DETECTED NONE DETECTED   Benzodiazepines NONE DETECTED NONE DETECTED   Amphetamines NONE DETECTED NONE DETECTED   Tetrahydrocannabinol POSITIVE (A) NONE DETECTED   Barbiturates NONE DETECTED NONE DETECTED    Comment: (NOTE) DRUG SCREEN FOR MEDICAL PURPOSES ONLY.  IF CONFIRMATION IS NEEDED FOR ANY PURPOSE, NOTIFY LAB WITHIN 5 DAYS. LOWEST DETECTABLE LIMITS FOR URINE DRUG SCREEN Drug Class                     Cutoff (ng/mL) Amphetamine and metabolites    1000 Barbiturate and metabolites    200 Benzodiazepine                 200 Tricyclics and  metabolites     300 Opiates and metabolites        300 Cocaine and metabolites        300 THC                            50 Performed at [facility], [address], [place], [place] [address]     Blood Alcohol level:  Lab Results  Component Value Date   [facility] <10 05/27/2019   ETH <10 11/19/2018    Physical Findings: AIMS:  , ,  ,  ,    CIWA:    COWS:     Musculoskeletal: Did not witness during telepsych evaluation  Psychiatric Specialty Exam: Physical Exam  Review of Systems  Blood pressure 105/66, pulse 63, temperature 98.6 F (37 C), temperature source Oral, resp. rate 18, SpO2 97 %.There is no height or weight on file to calculate BMI.  General Appearance: Bizarre and Guarded  Eye Contact:  Good  Speech:  Pressured  Volume:  Increased  Mood:  Angry and Irritable  Affect:  Congruent and Flat  Thought Process:  Goal Directed and Descriptions of Associations: Tangential  Orientation:  Other:  states she at [facility] hospital but does not answer additional orientation questions  Thought Content:  Illogical, Delusions and Paranoid Ideation  Suicidal Thoughts:  patient does not answer this question  Homicidal Thoughts:  patient does not answer this question  Memory:  Immediate;   Fair Recent;   Fair Remote;   Fair  Judgement:  Impaired  Insight:  Lacking  Psychomotor Activity:  Increased  Concentration:  Concentration: Poor and Attention Span: Poor  Recall:  [employer] of Knowledge:  Fair  Language:  Fair  Akathisia:  Negative  Handed:  Right  AIMS (if indicated):     Assets:  Financial Resources/Insurance Housing Social Support  ADL's:  Intact  Cognition:  WNL and Impaired,  Moderate  Sleep:   per nursing notes pt sleep on and off for 2 hour intervals    Treatment Plan Summary: She continues to meet inpatient criteria and SW to seek placement.     Daily contact with patient to assess and evaluate symptoms and progress in treatment and  Medication management   She was discharged from [facility] in Feb of this year on Caplyta 42mg  capsules.  I tried to restart this but it is not available at pharmacy. Pending EKG results will start the following antipsychotic medications as outlined below.  Patient was no cooperative when staff attempted to get an EKG on yesterday.   Continue  Prozac 20mg  daily for depression  Start (pending EKG results) Risperdal 2mg  po BID for psychosis Benztropine 1mg  po BID for EPS Hydroxyzine 25mg  TID prn anxiety  PRN Haldol 5mg  po or IM for severe agitation and psychosis  Above reviewed with EDP Dr. .   , NP 05/29/2019, 11:23 AM

## 2019-05-29 NOTE — ED Notes (Signed)
Pt refuse to take shower for two days now RN notify

## 2019-05-29 NOTE — ED Notes (Signed)
Notified pharmacy that risperdal needs to be restocked in the pixis. Pt has been refusing all interventions and medications. She is not agitated except to be angry that she is here.

## 2019-05-29 NOTE — ED Provider Notes (Signed)
Emergency Medicine Observation Re-evaluation Note  Angie Lopez is a 19 y.o. female, seen on rounds today.  Pt initially presented to the ED for complaints of Medical Clearance Currently, the patient is being treated for psychosis  Physical Exam  BP 105/66 (BP Location: Left Arm)   Pulse 63   Temp 98.6 F (37 C) (Oral)   Resp 18   SpO2 97%  Physical Exam  ED Course / MDM  EKG:    I have reviewed the labs performed to date as well as medications administered while in observation.  Recent changes in the last 24 hours include patient has stabilized.  She has been calm and cooperative per nursing. Plan  Current plan is for psychiatric placement. Patient is under full IVC at this time.   Lorre Nick, MD 05/29/19 [phone]

## 2019-05-29 NOTE — Progress Notes (Signed)
Pt was denied placement at [facility] per [place] w/ admissions.   Ruthann Cancer MSW, LCSWA Clincal Social Worker Disposition  [facility] Ph: [phone] Fax: [phone]

## 2019-05-30 ENCOUNTER — Encounter (HOSPITAL_COMMUNITY): Payer: Self-pay | Admitting: Registered Nurse

## 2019-05-30 DIAGNOSIS — R4689 Other symptoms and signs involving appearance and behavior: Secondary | ICD-10-CM | POA: Diagnosis not present

## 2019-05-30 DIAGNOSIS — F3110 Bipolar disorder, current episode manic without psychotic features, unspecified: Secondary | ICD-10-CM | POA: Diagnosis not present

## 2019-05-30 MED ORDER — HALOPERIDOL LACTATE 5 MG/ML IJ SOLN
10.0000 mg | Freq: Four times a day (QID) | INTRAMUSCULAR | Status: DC | PRN
  Administered 2019-05-30 – 2019-05-31 (×2): 10 mg via INTRAMUSCULAR
  Filled 2019-05-30 (×2): qty 2

## 2019-05-30 MED ORDER — HALOPERIDOL 5 MG PO TABS
10.0000 mg | ORAL_TABLET | Freq: Four times a day (QID) | ORAL | Status: DC | PRN
  Filled 2019-05-30: qty 2

## 2019-05-30 MED ORDER — RISPERIDONE 1 MG PO TBDP
3.0000 mg | ORAL_TABLET | Freq: Two times a day (BID) | ORAL | Status: DC
  Filled 2019-05-30: qty 3

## 2019-05-30 NOTE — Consult Note (Signed)
[facility] Psych ED Progress Note  05/30/2019 11:52 AM Angie Lopez  MRN:  [id number]   Subjective:  "I need to go home"  Principal Problem: Bipolar I disorder with mania ([place]) Diagnosis:  Principal Problem:   Bipolar I disorder with mania ([place])  Total Time spent with patient: 30 minutes     Angie Lopez, 19 y.o., female patient seen via tele psych by this provider, Consulted with Dr. Mallie Darting; and chart reviewed on 05/30/19.  On evaluation Angie Lopez unwilling to answer question.  Patient asking "Why are you concerned?  Why am I here?  What is an IVC?"  Patient states that she was fine and when asked what happened to get her brought to hospital; she then became upset and started asking questions herself; When this provider tried to give answers to her questions patient stated "I need to go home.  She is not making any sense.  She getting paid for nothing.  I'm ready to go home."  Patient continues to present as gurnard, paranoid, delusional and uncooperative with agitation with pressured speech.  Not giving provider a chance to answer her questions.  Patient has been started on psychotropic medications as listed below.  The Risperdal was increased to 3 mg Q hs.  Patient continues to need inpatient psychiatric treatment       Past Psychiatric History: as outlined below  Past Medical History:  Past Medical History:  Diagnosis Date  . Acne 08/2011   epiduo 2012  . ADHD (attention deficit hyperactivity disorder) 2009   HTN on stimulants, off meds 03/2012  . Allergic conjunctivitis 2012  . Allergic rhinitis 08/2010  . Learning disorder 08/2010   borderline IQ, had IEP in 2012  . Obesity 08/13/2010  . Prediabetes    HBA1C 6.0 03/2010, never took metromin in 2012-2014  . SCFE (slipped capital femoral epiphysis) 2011    Past Surgical History:  Procedure Laterality Date  . HIP PINNING  12/2009   Family History:  Family History  Problem Relation Age of Onset  . Obesity Sister   . Obesity  Brother   . Hypertension Maternal Grandmother   . Diabetes Maternal Grandmother   . Obesity Maternal Grandmother   . Hypertension Maternal Grandfather   . Obesity Mother   . Diabetes Maternal Aunt   . Obesity Maternal Aunt   . Diabetes Cousin   . Thyroid disease Neg Hx    Family Psychiatric  History: unknown Social History:  Social History   Substance and Sexual Activity  Alcohol Use None     Social History   Substance and Sexual Activity  Drug Use Yes  . Types: Marijuana, Cocaine    Social History   Socioeconomic History  . Marital status: Single    Spouse name: Not on file  . Number of children: Not on file  . Years of education: Not on file  . Highest education level: Not on file  Occupational History  . Not on file  Tobacco Use  . Smoking status: Former Smoker    Types: Cigarettes  . Smokeless tobacco: Never Used  Substance and Sexual Activity  . Alcohol use: Not on file  . Drug use: Yes    Types: Marijuana, Cocaine  . Sexual activity: Yes    Birth control/protection: Implant  Other Topics Concern  . Not on file  Social History Narrative   Lives at home with mom and sister, attends [employer] is in 7th grade. [employer].    Social Determinants of  Health   Financial Resource Strain:   . Difficulty of Paying Living Expenses:   Food Insecurity:   . Worried About Programme researcher, broadcasting/film/video in the Last Year:   . Barista in the Last Year:   Transportation Needs:   . Freight forwarder (Medical):   Marland Kitchen Lack of Transportation (Non-Medical):   Physical Activity:   . Days of Exercise per Week:   . Minutes of Exercise per Session:   Stress:   . Feeling of Stress :   Social Connections:   . Frequency of Communication with Friends and Family:   . Frequency of Social Gatherings with Friends and Family:   . Attends Religious Services:   . Active Member of Clubs or Organizations:   . Attends Banker Meetings:   Marland Kitchen Marital Status:      Sleep: Fair  Appetite:  Fair  Current Medications: Current Facility-Administered Medications  Medication Dose Route Frequency Provider Last Rate Last Admin  . acetaminophen (TYLENOL) tablet 650 mg  650 mg Oral Q4H PRN Henderly, Britni A, PA-C      . FLUoxetine (PROZAC) capsule 20 mg  20 mg Oral Daily Ophelia Shoulder E, NP      . haloperidol (HALDOL) tablet 10 mg  10 mg Oral Q6H PRN Antonieta Pert, MD       Or  . haloperidol lactate (HALDOL) injection 10 mg  10 mg Intramuscular Q6H PRN Antonieta Pert, MD      . hydrOXYzine (ATARAX/VISTARIL) tablet 25 mg  25 mg Oral TID PRN Ophelia Shoulder E, NP      . risperiDONE (RISPERDAL M-TABS) disintegrating tablet 3 mg  3 mg Oral BID Antonieta Pert, MD       Current Outpatient Medications  Medication Sig Dispense Refill  . FLUoxetine (PROZAC) 20 MG capsule Take 1 capsule (20 mg total) by mouth daily. (Patient not taking: Reported on 11/17/2018) 30 capsule 3  . Lumateperone Tosylate (CAPLYTA) 42 MG CAPS Take 42 mg by mouth daily. (Patient not taking: Reported on 01/20/2019) 30 capsule 11  . omega-3 acid ethyl esters (LOVAZA) 1 g capsule Take 1 capsule (1 g total) by mouth 2 (two) times daily. (Patient not taking: Reported on 01/20/2019) 60 capsule 5  . Prenatal Vit-Fe Fumarate-FA (PRENATAL MULTIVITAMIN) TABS tablet Take 1 tablet by mouth daily at 12 noon. (Patient not taking: Reported on 01/20/2019) 90 tablet 1  . temazepam (RESTORIL) 30 MG capsule Take 1 capsule (30 mg total) by mouth at bedtime. (Patient not taking: Reported on 05/27/2019) 30 capsule 0    Lab Results:  No results found for this or any previous visit (from the past 48 hour(s)).  Blood Alcohol level:  Lab Results  Component Value Date   ETH <10 05/27/2019   ETH <10 11/19/2018    Physical Findings: AIMS:  , ,  ,  ,    CIWA:    COWS:     Musculoskeletal: Did not witness during telepsych evaluation  Psychiatric Specialty Exam: Physical Exam Vitals and nursing  note reviewed. Exam conducted with a chaperone present (Sitter at bedside).  Psychiatric:        Mood and Affect: Mood is anxious.        Behavior: Behavior is agitated.        Thought Content: Thought content is paranoid and delusional.        Cognition and Memory: Cognition is impaired. Memory is impaired.  Judgment: Judgment is impulsive.     Review of Systems  Psychiatric/Behavioral: Positive for agitation, behavioral problems, confusion, decreased concentration and hallucinations. The patient is nervous/anxious.   All other systems reviewed and are negative.   Blood pressure 120/84, pulse 62, temperature 98.9 F (37.2 C), temperature source Oral, resp. rate 20, SpO2 100 %.There is no height or weight on file to calculate BMI.  General Appearance: Bizarre and Guarded  Eye Contact:  Good  Speech:  Pressured  Volume:  Increased  Mood:  Angry and Irritable  Affect:  Congruent and Flat  Thought Process:  Goal Directed and Descriptions of Associations: Tangential  Orientation:  Other:  states she at [facility] hospital but does not answer additional orientation questions  Thought Content:  Illogical, Delusions and Paranoid Ideation  Suicidal Thoughts:  patient does not answer this question  Homicidal Thoughts:  patient does not answer this question  Memory:  Immediate;   Fair Recent;   Fair Remote;   Fair  Judgement:  Impaired  Insight:  Lacking  Psychomotor Activity:  Increased  Concentration:  Concentration: Poor and Attention Span: Poor  Recall:  [employer] of Knowledge:  Fair  Language:  Fair  Akathisia:  Negative  Handed:  Right  AIMS (if indicated):     Assets:  Financial Resources/Insurance Housing Social Support  ADL's:  Intact  Cognition:  WNL and Impaired,  Moderate  Sleep:   per nursing notes pt sleep on and off for 2 hour intervals    Treatment Plan Summary: She continues to meet inpatient criteria and SW to seek placement.     Daily contact with patient  to assess and evaluate symptoms and progress in treatment and Medication management   EKG not completed.  Patient will need baseline EKG related to psychotropic medication. EKG ordered yesterday.  Review of last EKG 915/2020 >> Normal sinus rhythm with nonspecific T wave abnormality.  QT/QTc 390/414.  With patient history of agitation; recommend completing ECG prior to giving multiple antipsychotics.     Patient has been refusing Risperdal and Prozac.  She has received one dose of Geodon 20 mg IM and also has Agitation protocol for Haldol 5 mg PO or IM Q 6 hrs prn agitation.  EKG needs to be completed.    Continue Prozac 20 mg daily for depression Increased Risperdal 3 mg po BID for psychosis Continue Benztropine 1 mg po BID for EPS Continue Hydroxyzine 25 mg TID prn anxiety  PRN Haldol 5mg  po or IM for severe agitation and psychosis  Above reviewed with EDP Dr. .   Coriann Brouhard, NP 05/30/2019, 11:52 AM

## 2019-05-30 NOTE — ED Notes (Signed)
Pt attempted to leave unit stating "I am not a mental patient you cannot keep me here." Security came, RN was able to get pt back to room.

## 2019-05-30 NOTE — ED Notes (Signed)
Pt become upset, agitated, wanting to leave and trying to walk out, pt was redirected to room. Pt was difficult to redirect PRN  Haldol 10 mg IM given , effective Pt resting comfortably.

## 2019-05-30 NOTE — ED Notes (Signed)
Pt continues to rest in bed. Calm, no s/s of distress. Pt continues to be paranoid and suspicious. Pt has not been combative.

## 2019-05-30 NOTE — ED Notes (Signed)
Pt refused medication this AM Pt alert. Pt irritable, wants to go home.  Pt having paranoid, suspicious thoughts. Pt difficult to reorient. Pt lying in bed comfortably.

## 2019-05-30 NOTE — BH Assessment (Signed)
[facility] Assessment Progress Note  Per [name] Rankin, FNP, this pt requires psychiatric hospitalization at this time.  Pt presents under IVC initiated by pt's mother, and upheld by EDP Benjiman Core, MD.  The following facilities have been contacted to seek placement for this pt, with results as noted:  Beds available, information sent, decision pending: [facility]  Unable to reach: [facility] (left message at 12:10) [facility] (left message at 13:45) [facility] (left message at 13:55)  At capacity: [facility], [place] [facility] Coordinator [phone]

## 2019-05-31 ENCOUNTER — Encounter (HOSPITAL_COMMUNITY): Payer: Self-pay | Admitting: Registered Nurse

## 2019-05-31 ENCOUNTER — Other Ambulatory Visit: Payer: Self-pay

## 2019-05-31 ENCOUNTER — Inpatient Hospital Stay (HOSPITAL_COMMUNITY)
Admission: AD | Admit: 2019-05-31 | Discharge: 2019-06-07 | DRG: 885 | Disposition: A | Discharge status: Home / Self Care | Payer: No Typology Code available for payment source | Source: Intra-hospital | Attending: Psychiatry | Admitting: Psychiatry

## 2019-05-31 DIAGNOSIS — F29 Unspecified psychosis not due to a substance or known physiological condition: Secondary | ICD-10-CM | POA: Diagnosis not present

## 2019-05-31 DIAGNOSIS — F940 Selective mutism: Secondary | ICD-10-CM | POA: Diagnosis present

## 2019-05-31 DIAGNOSIS — F22 Delusional disorders: Secondary | ICD-10-CM | POA: Diagnosis present

## 2019-05-31 DIAGNOSIS — R7303 Prediabetes: Secondary | ICD-10-CM | POA: Diagnosis present

## 2019-05-31 DIAGNOSIS — Z87891 Personal history of nicotine dependence: Secondary | ICD-10-CM | POA: Diagnosis not present

## 2019-05-31 DIAGNOSIS — I10 Essential (primary) hypertension: Secondary | ICD-10-CM | POA: Diagnosis present

## 2019-05-31 DIAGNOSIS — F819 Developmental disorder of scholastic skills, unspecified: Secondary | ICD-10-CM | POA: Diagnosis present

## 2019-05-31 DIAGNOSIS — F209 Schizophrenia, unspecified: Principal | ICD-10-CM | POA: Diagnosis present

## 2019-05-31 DIAGNOSIS — Z9114 Patient's other noncompliance with medication regimen: Secondary | ICD-10-CM

## 2019-05-31 DIAGNOSIS — F1220 Cannabis dependence, uncomplicated: Secondary | ICD-10-CM | POA: Diagnosis not present

## 2019-05-31 DIAGNOSIS — F909 Attention-deficit hyperactivity disorder, unspecified type: Secondary | ICD-10-CM | POA: Diagnosis present

## 2019-05-31 DIAGNOSIS — J309 Allergic rhinitis, unspecified: Secondary | ICD-10-CM | POA: Diagnosis present

## 2019-05-31 DIAGNOSIS — F200 Paranoid schizophrenia: Secondary | ICD-10-CM | POA: Diagnosis not present

## 2019-05-31 MED ORDER — HALOPERIDOL LACTATE 5 MG/ML IJ SOLN
10.0000 mg | Freq: Two times a day (BID) | INTRAMUSCULAR | Status: DC
  Administered 2019-05-31: 10 mg via INTRAMUSCULAR
  Filled 2019-05-31 (×5): qty 2

## 2019-05-31 MED ORDER — HALOPERIDOL LACTATE 5 MG/ML IJ SOLN: 10.0000 mg | Freq: Four times a day (QID) | INTRAMUSCULAR | Status: DC | PRN

## 2019-05-31 MED ORDER — HALOPERIDOL 5 MG PO TABS: 10.0000 mg | ORAL_TABLET | Freq: Four times a day (QID) | ORAL | Status: DC | PRN

## 2019-05-31 MED ORDER — FLUOXETINE HCL 20 MG PO CAPS
20.0000 mg | ORAL_CAPSULE | Freq: Every day | ORAL | Status: DC
  Administered 2019-06-03 – 2019-06-04 (×2): 20 mg via ORAL
  Filled 2019-05-31 (×9): qty 1

## 2019-05-31 MED ORDER — RISPERIDONE 1 MG PO TBDP
3.0000 mg | ORAL_TABLET | Freq: Two times a day (BID) | ORAL | Status: DC
  Administered 2019-05-31: 3 mg via ORAL
  Filled 2019-05-31 (×7): qty 3

## 2019-05-31 MED ORDER — HALOPERIDOL 5 MG PO TABS
10.0000 mg | ORAL_TABLET | Freq: Two times a day (BID) | ORAL | Status: DC
  Filled 2019-05-31 (×5): qty 2

## 2019-05-31 MED ORDER — ACETAMINOPHEN 325 MG PO TABS: 650.0000 mg | ORAL_TABLET | ORAL | Status: DC | PRN

## 2019-05-31 MED ORDER — HYDROXYZINE HCL 25 MG PO TABS
25.0000 mg | ORAL_TABLET | Freq: Three times a day (TID) | ORAL | Status: DC | PRN
  Filled 2019-05-31: qty 1

## 2019-05-31 NOTE — Tx Team (Signed)
Initial Treatment Plan 05/31/2019 2:06 PM Angie Lopez [id number]    PATIENT STRESSORS: Financial difficulties Marital or family conflict Medication change or noncompliance Occupational concerns Substance abuse   PATIENT STRENGTHS: Physical Health Supportive family/friends   PATIENT IDENTIFIED PROBLEMS: delusions  anger  paranoia  Substance use/abuse               DISCHARGE CRITERIA:  Ability to meet basic life and health needs Improved stabilization in mood, thinking, and/or behavior Motivation to continue treatment in a less acute level of care Need for constant or close observation no longer present  PRELIMINARY DISCHARGE PLAN: Attend aftercare/continuing care group Participate in family therapy Return to previous living arrangement  PATIENT/FAMILY INVOLVEMENT: This treatment plan has been presented to and reviewed with the patient, Angie Lopez.  The patient and family have been given the opportunity to ask questions and make suggestions.  Raylene Miyamoto, RN 05/31/2019, 2:06 PM

## 2019-05-31 NOTE — Progress Notes (Signed)
Patient ID: Angie Lopez, female   DOB: 2000-04-04, 19 y.o.   MRN: [id number] Admission Note  Pt is a 19 yo female that presents IVC'd to [facility] on 05/31/2019. Pt states that she came to the hospital because "I ordered some food, my mom didn't like it, so she called the cops on me. It's about money. It's always about money". Pt denies having a job but does state they have money saved. Pt states they could get $20-35/ hour jobs but is currently unemployed by choice. Pt is agitated with how they arrived at the hospital and how medications were "forced" onto them. Pt states "they don't know but I could have had the whole place shot up. One phone call". Pt was provided education on if this was an appropriate response. Pt showed little insight into teaching. Pt denies taking any medications. "When I take medications it makes the people around me crazy". Pt denies a current pcp. Pt denies using drugs/alcohol/tobacco/Rx's. Pt denies past/present verbal/physical/sexual abuse. Pt denies current self neglect; this Probation officer concerned. Pt denies any current pain or physical symptoms. Pt had a small scab on their R knee. Pt denies complaints. Wound healing. Pt safe on the unit. q23m safety checks implemented and continued. Pt denies current si/hi/ah/vh and verbally agrees to approach staff if these become apparent or before harming self/others while at [place] signed, skin/belongings search completed and patient oriented to unit. Patient stable at this time. Patient given the opportunity to express concerns and ask questions. Patient given toiletries. Will continue to monitor.   [place] Assessment 05/28/2019:  Angie Lopez is an 19 y.o. female, who presents involuntary and unaccompanied to [facility]. Clinician provided her name, position and location to introduce herself to the pt. Clinician asked the pt, "what brought you to the hospital?" Pt reported, "an argument." Pt reported, a stranger was in her house, she asked the stranger  to call the police, when the police arrived they brought her to the hospital. Pt reported, the stranger wanted her to hurt them. Clinician asked the pt if she has suicidal thoughts. Pt replied, "no, I got money." Clinician asked the pt if she had homicidal thoughts. Pt replied, "I know myself, I'm not weak, I could of murked them." Clinician asked the pt if she had access to weapons. Pt became upset then asked, "do you, do you, what if a zombie came or someone broke in you house; would you have a weapon?" Pt also asked, "who are you?" Clinician reintroduced herself. Pt reported, "how are you a counselor?" Pt then asked clinician to hang up.   Pt was IVC'd by her mother [name], [phone]). Per IVC paperwork: "Respondent is diagnosed as having psychosis. Is not taking her prescribed medications. Is hostile and aggressive towards others. Is having hallucinations claming herm other had kidnapped her and she saw her mother kill a child etc. Is smoking marijuana daily. 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 Is a danger to herself and others."   Clinician called pt's mother to gather collateral information. Per mother, the pt was discharged from [facility] last year but did not follow up with taking medications. Pt's mother reported, the pt stays in her room all day everyday, she comes out to get food and goes back in her room. Pt's mother reported, the pt does not got outside or have any friends. Per mother, today before coming home she went to the hair store to buy the pt hair (she like to braid her hair). Pt's mother reported,  the pt started working on her hair and she asked the pt if she was okay. Per mother, the pt stood in her doorway and started cursing at her, calling her names. Pt's mother reported, she called the police, when they arrived they brought a therapist with them and recommended she fill out IVC paperwork. Per mother, the pt fought the police and was screaming at the top of her lungs. Pt's mother  reported, the pt said she killed her boyfriends grandson, on Thursday she text her father back to back with paranoid delusions.Pt's mother reported, recently she can not calm the pt. Pt's mother reported, she lost the pt's prescription during a move but the pt did not take the samples. Pt's mother reported, last week she called [facility] to scheduled the pt a medication management but was unable to because the pt is over 18. Pt's mother there are kitchen knives in the home. Pt's mother does not feel the pt will be safe if discharged from the ED.

## 2019-05-31 NOTE — ED Notes (Signed)
Pt DC d off unit to [facility] per provider. Pt alert, cooperative with transfer, no s/s of distress. DC information given to [employer] for facility . Belonging given to [employer] for facility. Pt ambulatory off the unit, escorted and transported by [employer].

## 2019-05-31 NOTE — Consult Note (Signed)
[facility] Psych ED Progress Note  05/31/2019 10:10 AM Angie Lopez  MRN:  [id number]   Subjective:  "I need to go home"  Principal Problem: Bipolar I disorder with mania ([place]) Diagnosis:  Principal Problem:   Bipolar I disorder with mania ([place]) Active Problems:   Aggressive behavior  Total Time spent with patient: 30 minutes     Angie Lopez, 19 y.o., female patient seen via tele psych by this provider, Consulted with Dr. Mallie Darting; and chart reviewed on 05/31/19.  On evaluation Angie Lopez is sitting up in bed asking when she can go home.  Patient states that she is feeling fine and "ready to go."  Patient reports that she is sleeping and eating without difficulty.  States that she has not taken any medication because "They say its for schizo or something like that and it looks different than the medicine I took before."  Patient states that she did have outpatient psychiatric services at [facility] at one time; but doesn't remember the names of medication she was taking just what the pill looked like.  Patient denies suicidal/self-harm/homicidal ideation, psychosis, and paranoia.  Patient reports that she lives with her father and that she is suppose to speak with him around 10 AM.  Patient gave permission to speak with her father Angie Lopez at [phone].   During evaluation Angie Lopez is alert/oriented x 3 calm/cooperative; and mood is congruent with affect.  She does not appear to be responding to internal/external stimuli or delusional thoughts.  Patient denies suicidal/self-harm/homicidal ideation, psychosis, and paranoia.  Patient answered question appropriately today.     Collateral information:    Spoke with patients nurse Angie Arms, RN) who states that there has been no behavioral outburst by patient and that she has not noticed patient responding auditory or visual hallucinations.  States that patient continues to be paranoid about medications and continues to decline to take any  medicine "She will take the medicine and bring up toward her mouth like she is going to take it and then pull it away saying I need to gain weight and this won't help and then she won't take it; gives it back.  States that patient was given Haldol 10 mg yesterday when she wanted to leave and tried to leave the unit; but patient hasn't been combative just paranoid and suspicious about everything.   Spoke to patients father Angie Lopez at [phone]):  who states that patient been acting bizarre for "a minute now.  She was taking out of her head.  One day she texted me about 50 times telling me that it wasn't her mamas house and that she was just staying there, telling me that her Joselyn Arrow was really a man.  Saying that everybody was out to get her.  Her sister staying in the old house and out to get her to.  I had told her mother that she needed some help.  It had gotten to the point that no body at the house felt comfortable because didn't know what she would do; they would be afraid to go to sleep at night."  Mr. Ammar also states that patient lives with her mother.  States that he is aware of one other time that patient had to be admitted to a psychiatric hospital "at that time she was hearing voices telling her to do stuff."  States that he is aware that she smokes marijuana but doesn't know how often or if the symptoms is related to marijuana use "I  had a feeling that somebody might have gave her something; I try to tell them you can't trust everybody and they don't know what could have been done to the drugs.  I don't give her money; once I found out she was smoking pot I stopped giving her money she could use to buy drugs.  I tried to explain to her that she wasn't a child anymore and she needed to figure out what she was going to do with her life."  States that he feels she needs some kind of help because the bizarre behavior has been going on for a while and was just getting worse.     Past Psychiatric  History: Possibly one psychiatric hospitalization for auditory hallucinations; prior outpatient psychiatric services at [facility].  Cannabis use disorder which may be the result of patiens behavior and psychosis  Past Medical History:  Past Medical History:  Diagnosis Date  . Acne 08/2011   epiduo 2012  . ADHD (attention deficit hyperactivity disorder) 2009   HTN on stimulants, off meds 03/2012  . Allergic conjunctivitis 2012  . Allergic rhinitis 08/2010  . Learning disorder 08/2010   borderline IQ, had IEP in 2012  . Obesity 08/13/2010  . Prediabetes    HBA1C 6.0 03/2010, never took metromin in 2012-2014  . SCFE (slipped capital femoral epiphysis) 2011    Past Surgical History:  Procedure Laterality Date  . HIP PINNING  12/2009   Family History:  Family History  Problem Relation Age of Onset  . Obesity Sister   . Obesity Brother   . Hypertension Maternal Grandmother   . Diabetes Maternal Grandmother   . Obesity Maternal Grandmother   . Hypertension Maternal Grandfather   . Obesity Mother   . Diabetes Maternal Aunt   . Obesity Maternal Aunt   . Diabetes Cousin   . Thyroid disease Neg Hx    Family Psychiatric  History: unknown Social History:  Social History   Substance and Sexual Activity  Alcohol Use None     Social History   Substance and Sexual Activity  Drug Use Yes  . Types: Marijuana, Cocaine    Social History   Socioeconomic History  . Marital status: Single    Spouse name: Not on file  . Number of children: Not on file  . Years of education: Not on file  . Highest education level: Not on file  Occupational History  . Not on file  Tobacco Use  . Smoking status: Former Smoker    Types: Cigarettes  . Smokeless tobacco: Never Used  Substance and Sexual Activity  . Alcohol use: Not on file  . Drug use: Yes    Types: Marijuana, Cocaine  . Sexual activity: Yes    Birth control/protection: Implant  Other Topics Concern  . Not on file  Social History  Narrative   Lives at home with mom and sister, attends [employer] is in 7th grade. [name].    Social Determinants of Health   Financial Resource Strain:   . Difficulty of Paying Living Expenses:   Food Insecurity:   . Worried About Programme researcher, broadcasting/film/video in the Last Year:   . Barista in the Last Year:   Transportation Needs:   . Freight forwarder (Medical):   Marland Kitchen Lack of Transportation (Non-Medical):   Physical Activity:   . Days of Exercise per Week:   . Minutes of Exercise per Session:   Stress:   . Feeling of Stress :  Social Connections:   . Frequency of Communication with Friends and Family:   . Frequency of Social Gatherings with Friends and Family:   . Attends Religious Services:   . Active Member of Clubs or Organizations:   . Attends Banker Meetings:   Marland Kitchen Marital Status:     Sleep: Good  Appetite:  Good  Current Medications: Current Facility-Administered Medications  Medication Dose Route Frequency Provider Last Rate Last Admin  . acetaminophen (TYLENOL) tablet 650 mg  650 mg Oral Q4H PRN Henderly, Britni A, PA-C      . FLUoxetine (PROZAC) capsule 20 mg  20 mg Oral Daily Ophelia Shoulder E, NP      . haloperidol (HALDOL) tablet 10 mg  10 mg Oral Q6H PRN Antonieta Pert, MD       Or  . haloperidol lactate (HALDOL) injection 10 mg  10 mg Intramuscular Q6H PRN Antonieta Pert, MD   10 mg at 05/30/19 1757  . hydrOXYzine (ATARAX/VISTARIL) tablet 25 mg  25 mg Oral TID PRN Chales Abrahams, NP      . risperiDONE (RISPERDAL M-TABS) disintegrating tablet 3 mg  3 mg Oral BID Antonieta Pert, MD       Current Outpatient Medications  Medication Sig Dispense Refill  . FLUoxetine (PROZAC) 20 MG capsule Take 1 capsule (20 mg total) by mouth daily. (Patient not taking: Reported on 11/17/2018) 30 capsule 3  . Lumateperone Tosylate (CAPLYTA) 42 MG CAPS Take 42 mg by mouth daily. (Patient not taking: Reported on 01/20/2019) 30 capsule 11  . omega-3  acid ethyl esters (LOVAZA) 1 g capsule Take 1 capsule (1 g total) by mouth 2 (two) times daily. (Patient not taking: Reported on 01/20/2019) 60 capsule 5  . Prenatal Vit-Fe Fumarate-FA (PRENATAL MULTIVITAMIN) TABS tablet Take 1 tablet by mouth daily at 12 noon. (Patient not taking: Reported on 01/20/2019) 90 tablet 1  . temazepam (RESTORIL) 30 MG capsule Take 1 capsule (30 mg total) by mouth at bedtime. (Patient not taking: Reported on 05/27/2019) 30 capsule 0    Lab Results:  No results found for this or any previous visit (from the past 48 hour(s)).  Blood Alcohol level:  Lab Results  Component Value Date   ETH <10 05/27/2019   ETH <10 11/19/2018    Physical Findings: AIMS:  , ,  ,  ,    CIWA:    COWS:     Musculoskeletal: Did not witness during telepsych evaluation  Psychiatric Specialty Exam: Physical Exam Vitals and nursing note reviewed. Exam conducted with a chaperone present (Sitter at bedside).  Psychiatric:        Mood and Affect: Mood is anxious.        Behavior: Behavior is agitated.        Thought Content: Thought content is paranoid and delusional.        Cognition and Memory: Cognition is impaired. Memory is impaired.        Judgment: Judgment is impulsive.     Review of Systems  Psychiatric/Behavioral: Positive for agitation, behavioral problems, confusion, decreased concentration and hallucinations. The patient is nervous/anxious.   All other systems reviewed and are negative.   Blood pressure 107/75, pulse 72, temperature 98.5 F (36.9 C), temperature source Oral, resp. rate 20, SpO2 100 %.There is no height or weight on file to calculate BMI.  General Appearance: Bizarre and Guarded  Eye Contact:  Good  Speech:  Normal Rate  Volume:  Increased  Mood:  "  Fine"  Paranoid  Affect:  Congruent  Thought Process:  Goal Directed and Descriptions of Associations: Tangential  Orientation:  Full (Time, Place, and Person)  Thought Content:  Paranoid Ideation   Suicidal Thoughts:  No Patient denies  Homicidal Thoughts:  No Patient denies  Memory:  Immediate;   Fair Recent;   Fair Remote;   Fair  Judgement:  Impaired  Insight:  Lacking  Psychomotor Activity:  Normal  Concentration:  Concentration: Fair and Attention Span: Fair  Recall:  [employer] of Knowledge:  Fair  Language:  Fair  Akathisia:  Negative  Handed:  Right  AIMS (if indicated):     Assets:  Financial Resources/Insurance Housing Social Support  ADL's:  Intact  Cognition:  WNL and Impaired,  Moderate  Sleep:   per nursing notes pt sleep on and off for 2 hour intervals    Treatment Plan Summary: She continues to meet inpatient criteria and SW to seek placement.     Daily contact with patient to assess and evaluate symptoms and progress in treatment and Medication management   EKG still not completed.  Patient will need baseline EKG related to psychotropic medication. EKG ordered yesterday.  Review of last EKG 915/2020 >> Normal sinus rhythm with nonspecific T wave abnormality.  QT/QTc 390/414.  With patient history of agitation; recommend completing ECG prior to giving multiple antipsychotics.     Patient continues to refuse Risperdal and Prozac.  She has received one dose of Geodon 20 mg IM and also has Agitation protocol for Haldol 5 mg PO or IM Q 6 hrs prn agitation.  EKG needs to be completed.    Continue Prozac 20 mg daily for depression Increased Risperdal 3 mg po BID for psychosis Continue Benztropine 1 mg po BID for EPS Continue Hydroxyzine 25 mg TID prn anxiety  PRN Haldol 10 mg po or IM for severe agitation and psychosis   Will continue to seek inpatient psychiatric treatment.  Patient has been accepted to [facility] observation unit.  No beds available on [address] hall.     Darya Bigler, NP 05/31/2019, 10:10 AM

## 2019-05-31 NOTE — Progress Notes (Signed)
Patient did not attend wrap up group because he was asleep. 

## 2019-05-31 NOTE — Progress Notes (Signed)
Progress note  Pt came for medication pass willingly. Pt was visibly agitated with taking medications. Pt tried to spit medications into their water cup and hastily threw the cup away. When approached pt stated "I took enough". Pt educated that if they can't take their medications orally they can be provided IM. Pt agitated but willing. Pt safe on the unit. q62m safety checks implemented and continued. Will continue to monitor.

## 2019-05-31 NOTE — BH Assessment (Addendum)
[facility] Assessment Progress Note  Per [name] Rankin, FNP, this pt is to be admitted to the [facility] Observation Unit with a view toward admitting her to the Adult Unit when a bed becomes available.  Angie Lopez has assigned pt to Obs Rm 403-1.  Pt presents under IVC initiated by pt's mother, and upheld by EDP Benjiman Core, MD, and IVC documents have been faxed to [facility].  Pt's nurse, [name], has been notified, and agrees to call report to [phone].  Pt is to be transported via Patent examiner.   Angie Lopez, [place] Behavioral Health Coordinator [phone]    Addendum:  [name] has changed pt's bed assignment.  She will go to 505-1 with a target time of 14:00.  [name] and [name] have both been notified.  Angie Lopez, [place] Behavioral Health Coordinator [phone]

## 2019-05-31 NOTE — Discharge Summary (Addendum)
  Patient to be transferred to [facility] for inpatient psychiatric treatment.  For detailed note see [facility] ED Psychiatric Progress Note.

## 2019-06-01 DIAGNOSIS — F22 Delusional disorders: Secondary | ICD-10-CM

## 2019-06-01 MED ORDER — TEMAZEPAM 30 MG PO CAPS
30.0000 mg | ORAL_CAPSULE | Freq: Every day | ORAL | Status: DC
  Filled 2019-06-01: qty 1

## 2019-06-01 MED ORDER — HALOPERIDOL LACTATE 5 MG/ML IJ SOLN
10.0000 mg | Freq: Four times a day (QID) | INTRAMUSCULAR | Status: DC | PRN
  Administered 2019-06-01: 10 mg via INTRAMUSCULAR
  Filled 2019-06-01: qty 2

## 2019-06-01 MED ORDER — BENZTROPINE MESYLATE 1 MG PO TABS
1.0000 mg | ORAL_TABLET | Freq: Two times a day (BID) | ORAL | Status: DC
  Administered 2019-06-02 – 2019-06-04 (×4): 1 mg via ORAL
  Filled 2019-06-01 (×15): qty 1

## 2019-06-01 NOTE — Progress Notes (Addendum)
D: Pt refused all oral medications this morning. Pt is angry, irritable, agitated, using profanity, calling staff derogatory names, hostile, refuses to cooperate with IM administration, eventually with a great deal of encouragement from several nurses pt accepted medication of the PRN IM injection for agitation. MD Jeannine Kitten notified of the above.   Pt also refused scheduled 1700 meds, refused to provide a reason, refused to accept any medication education on importance of taking meds, is easily irritable, came out for dinner, approached the nurses med room to request ginger ale and water, then went back to her room. Pt is seclusive to self and to room. Pt has an angry facial expression, is guarded, and suspicious.   A: Given medications for agitation, provided education which pt refused, provided emotional support and encouragement.   P: Will continue to monitor pt per Q15 minute face checks and monitor for safety and progress.

## 2019-06-01 NOTE — H&P (Signed)
Psychiatric Admission Assessment Adult  Patient Identification: Angie Lopez MRN:  [id number] Date of Evaluation:  06/01/2019 Chief Complaint:  Delusional disorder (HCC) [F22] Principal Diagnosis: <principal problem not specified> Diagnosis:  Active Problems:   Delusional disorder (HCC)  History of Present Illness:  This is the second psychiatric admission in about 6 months time for Angie Lopez, she is 61 we had diagnosed with new onset psychosis, probable schizophrenia in September 2020.  At that point in time she was suffering from delusional believes, possible hallucinations, but stabilized very well on lumateperone in addition to fluoxetine and neuro protective measures including omega-3's and metabolize B vitamins  Since discharge she apparently was noncompliant with his medication and follow-up, was also abusing cannabis daily, this led to a further exacerbation.  By the time she presented to the emergency department on 3/9 petition for involuntary commitment she was expressing delusional believes, physically combative kicking at the [facility] police and security so forth. During her days in the emergency department until admission here she was volatile, refusing care, threatening staff so forth.  Sleep is also been disrupted.  During her telepsych visit of 3/21 she was quite paranoid and agitated refusing to fully cooperate.  My exam this morning she basically refuses to fully cooperate, makes no eye contact stating she is fine and will not answer questions.  She is selectively mute.  Clearly paranoid and guarded and in need of treatment.  According to the initial assessment note: Angie Lopez is an 19 y.o. female, who presents involuntary and unaccompanied to [facility] Clinician provided her name, position and location to introduce herself to the pt. Clinician asked the pt, "what brought you to the hospital?" Pt reported, "an argument." Pt reported, a stranger was in her house, she asked  the stranger to call the police, when the police arrived they brought her to the hospital. Pt reported, the stranger wanted her to hurt them. Clinician asked the pt if she has suicidal thoughts. Pt replied, "no, I got money." Clinician asked the pt if she had homicidal thoughts. Pt replied, "I know myself, I'm not weak, I could of murked them." Clinician asked the pt if she had access to weapons. Pt became upset then asked, "do you, do you, what if a zombie came or someone broke in you house; would you have a weapon?" Pt also asked, "who are you?" Clinician reintroduced herself. Pt reported, "how are you a counselor?" Pt then asked clinician to hang up.   Pt was IVC'd by her mother Angie Lopez, [phone]). Per IVC paperwork: "Respondent is diagnosed as having psychosis. Is not taking her prescribed medications. Is hostile and aggressive towards others. Is having hallucinations claming herm other had kidnapped her and she saw her mother kill a child etc. Is smoking marijuana daily. 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 Is a danger to herself and others."   Clinician called pt's mother to gather collateral information. Per mother, the pt was discharged from [facility] last year but did not follow up with taking medications. Pt's mother reported, the pt stays in her room all day everyday, she comes out to get food and goes back in her room. Pt's mother reported, the pt does not got outside or have any friends. Per mother, today before coming home she went to the hair store to buy the pt hair (she like to braid her hair). Pt's mother reported, the pt started working on her hair and she asked the pt if she was okay. Per mother, the pt  stood in her doorway and started cursing at her, calling her names. Pt's mother reported, she called the police, when they arrived they brought a therapist with them and recommended she fill out IVC paperwork. Per mother, the pt fought the police and was screaming at the top of her lungs.  Pt's mother reported, the pt said she killed her boyfriends grandson, on Thursday she text her father back to back with paranoid delusions.Pt's mother reported, recently she can not calm the pt. Pt's mother reported, she lost the pt's prescription during a move but the pt did not take the samples. Pt's mother reported, last week she called [facility] to scheduled the pt a medication management but was unable to because the pt is over 18. Pt's mother there are kitchen knives in the home. Pt's mother does not feel the pt will be safe if discharged from the ED.   Pt has a previous inpatient admission to [facility] on 11/18/2018-11/22/2018.  Pt presents alert in scrubs with argumentative, aggressive speech. Pt's eye contact was poor. Pt's mood was irritable. Pt's affect was irritable, angry. Pt's judgement was impaired. Pt's concentration, insight and impulse control are poor.   According to the consult note of 3/23 [name], 19 y.o., female patient seen via tele psych by this provider, Consulted with Dr. Jola Babinski; and chart reviewed on 05/31/19.  On evaluation Angie Lopez is sitting up in bed asking when she can go home.  Patient states that she is feeling fine and "ready to go."  Patient reports that she is sleeping and eating without difficulty.  States that she has not taken any medication because "They say its for schizo or something like that and it looks different than the medicine I took before."  Patient states that she did have outpatient psychiatric services at [facility] at one time; but doesn't remember the names of medication she was taking just what the pill looked like.  Patient denies suicidal/self-harm/homicidal ideation, psychosis, and paranoia.  Patient reports that she lives with her father and that she is suppose to speak with him around 10 AM.  Patient gave permission to speak with her father Angie Lopez at [phone].   During evaluation Arrianna Victorino is alert/oriented x 3  calm/cooperative; and mood is congruent with affect.  She does not appear to be responding to internal/external stimuli or delusional thoughts.  Patient denies suicidal/self-harm/homicidal ideation, psychosis, and paranoia.  Patient answered question appropriately today.     Collateral information:    Spoke with patients nurse Lum Babe, RN) who states that there has been no behavioral outburst by patient and that she has not noticed patient responding auditory or visual hallucinations.  States that patient continues to be paranoid about medications and continues to decline to take any medicine "She will take the medicine and bring up toward her mouth like she is going to take it and then pull it away saying I need to gain weight and this won't help and then she won't take it; gives it back.  States that patient was given Haldol 10 mg yesterday when she wanted to leave and tried to leave the unit; but patient hasn't been combative just paranoid and suspicious about everything.   Spoke to patients father Amanie Mcculley at [phone]):  who states that patient been acting bizarre for "a minute now.  She was taking out of her head.  One day she texted me about 50 times telling me that it wasn't her mamas house and that  she was just staying there, telling me that her Cathren Harsh was really a man.  Saying that everybody was out to get her.  Her sister staying in the old house and out to get her to.  I had told her mother that she needed some help.  It had gotten to the point that no body at the house felt comfortable because didn't know what she would do; they would be afraid to go to sleep at night."  Mr. Merida also states that patient lives with her mother.  States that he is aware of one other time that patient had to be admitted to a psychiatric hospital "at that time she was hearing voices telling her to do stuff."  States that he is aware that she smokes marijuana but doesn't know how often or if the symptoms is  related to marijuana use "I had a feeling that somebody might have gave her something; I try to tell them you can't trust everybody and they don't know what could have been done to the drugs.  I don't give her money; once I found out she was smoking pot I stopped giving her money she could use to buy drugs.  I tried to explain to her that she wasn't a child anymore and she needed to figure out what she was going to do with her life."  States that he feels she needs some kind of help because the bizarre behavior has been going on for a while and was just getting worse.     Past Psychiatric History: Possibly one psychiatric hospitalization for auditory hallucinations; prior outpatient psychiatric services at [facility].  Cannabis use disorder which may be the result of patiens behavior and psychosis Associated Signs/Symptoms: Depression Symptoms:  insomnia, psychomotor agitation, disturbed sleep, (Hypo) Manic Symptoms:  Delusions, Distractibility, Hallucinations, Anxiety Symptoms:  n/a Psychotic Symptoms:  Delusions, Hallucinations: Auditory Visual PTSD Symptoms: NA Total Time spent with patient: 45 minutes  Past Psychiatric History: First manic episode was noted 11/2018 cannabis dependence problematic and noncompliance problematic  Is the patient at risk to self? Yes.    Has the patient been a risk to self in the past 6 months? Yes.    Has the patient been a risk to self within the distant past? No.  Is the patient a risk to others? Yes.    Has the patient been a risk to others in the past 6 months? Yes.    Has the patient been a risk to others within the distant past? No.   Prior Inpatient Therapy:   Prior Outpatient Therapy:    Alcohol Screening: 1. How often do you have a drink containing alcohol?: Never 2. How many drinks containing alcohol do you have on a typical day when you are drinking?: 1 or 2 3. How often do you have six or more drinks on one occasion?: Never AUDIT-C Score:  0 4. How often during the last year have you found that you were not able to stop drinking once you had started?: Never 5. How often during the last year have you failed to do what was normally expected from you becasue of drinking?: Never 6. How often during the last year have you needed a first drink in the morning to get yourself going after a heavy drinking session?: Never 7. How often during the last year have you had a feeling of guilt of remorse after drinking?: Never 8. How often during the last year have you been unable to remember what  happened the night before because you had been drinking?: Never 9. Have you or someone else been injured as a result of your drinking?: No 10. Has a relative or friend or a doctor or another health worker been concerned about your drinking or suggested you cut down?: No Alcohol Use Disorder Identification Test Final Score (AUDIT): 0 Substance Abuse History in the last 12 months:  Yes.   Consequences of Substance Abuse: NA Previous Psychotropic Medications: Yes  Psychological Evaluations: No  Past Medical History:  Past Medical History:  Diagnosis Date  . Acne 08/2011   epiduo 2012  . ADHD (attention deficit hyperactivity disorder) 2009   HTN on stimulants, off meds 03/2012  . Allergic conjunctivitis 2012  . Allergic rhinitis 08/2010  . Learning disorder 08/2010   borderline IQ, had IEP in 2012  . Obesity 08/13/2010  . Prediabetes    HBA1C 6.0 03/2010, never took metromin in 2012-2014  . SCFE (slipped capital femoral epiphysis) 2011    Past Surgical History:  Procedure Laterality Date  . HIP PINNING  12/2009   Family History:  Family History  Problem Relation Age of Onset  . Obesity Sister   . Obesity Brother   . Hypertension Maternal Grandmother   . Diabetes Maternal Grandmother   . Obesity Maternal Grandmother   . Hypertension Maternal Grandfather   . Obesity Mother   . Diabetes Maternal Aunt   . Obesity Maternal Aunt   . Diabetes  Cousin   . Thyroid disease Neg Hx    Family Psychiatric  History: see eval- denies Tobacco Screening:   Social History:  Social History   Substance and Sexual Activity  Alcohol Use Never  . Alcohol/week: 0.0 standard drinks     Social History   Substance and Sexual Activity  Drug Use Yes  . Types: Marijuana, Cocaine   Comment: pt denies current drug use    Additional Social History:                           Allergies:  No Known Allergies Lab Results: No results found for this or any previous visit (from the past 48 hour(s)).  Blood Alcohol level:  Lab Results  Component Value Date   ETH <10 05/27/2019   ETH <10 11/19/2018    Metabolic Disorder Labs:  Lab Results  Component Value Date   HGBA1C 5.0 11/19/2018   MPG 97 11/19/2018   Lab Results  Component Value Date   PROLACTIN 44.1 (H) 11/19/2018   Lab Results  Component Value Date   CHOL 170 11/19/2018   TRIG 50 11/19/2018   HDL 49 11/19/2018   CHOLHDL 3.5 11/19/2018   VLDL 10 11/19/2018   LDLCALC 111 (H) 11/19/2018    Current Medications: Current Facility-Administered Medications  Medication Dose Route Frequency Provider Last Rate Last Admin  . acetaminophen (TYLENOL) tablet 650 mg  650 mg Oral Q4H PRN Antonieta Pert, MD      . FLUoxetine (PROZAC) capsule 20 mg  20 mg Oral Daily Antonieta Pert, MD      . haloperidol (HALDOL) tablet 10 mg  10 mg Oral BID Antonieta Pert, MD       Or  . haloperidol lactate (HALDOL) injection 10 mg  10 mg Intramuscular BID Antonieta Pert, MD   10 mg at 05/31/19 1808  . hydrOXYzine (ATARAX/VISTARIL) tablet 25 mg  25 mg Oral TID PRN Antonieta Pert, MD      .  risperiDONE (RISPERDAL M-TABS) disintegrating tablet 3 mg  3 mg Oral BID Sharma Covert, MD   3 mg at 05/31/19 1808   PTA Medications: Medications Prior to Admission  Medication Sig Dispense Refill Last Dose  . FLUoxetine (PROZAC) 20 MG capsule Take 1 capsule (20 mg total) by mouth  daily. (Patient not taking: Reported on 11/17/2018) 30 capsule 3   . Lumateperone Tosylate (CAPLYTA) 42 MG CAPS Take 42 mg by mouth daily. (Patient not taking: Reported on 01/20/2019) 30 capsule 11   . omega-3 acid ethyl esters (LOVAZA) 1 g capsule Take 1 capsule (1 g total) by mouth 2 (two) times daily. (Patient not taking: Reported on 01/20/2019) 60 capsule 5   . Prenatal Vit-Fe Fumarate-FA (PRENATAL MULTIVITAMIN) TABS tablet Take 1 tablet by mouth daily at 12 noon. (Patient not taking: Reported on 01/20/2019) 90 tablet 1   . temazepam (RESTORIL) 30 MG capsule Take 1 capsule (30 mg total) by mouth at bedtime. (Patient not taking: Reported on 05/27/2019) 30 capsule 0     Musculoskeletal: Strength & Muscle Tone: within normal limits Gait & Station: normal Patient leans: N/A  Psychiatric Specialty Exam: Physical Exam  Nursing note and vitals reviewed. Constitutional: She appears well-developed and well-nourished.  Cardiovascular: Normal rate and regular rhythm.    Review of Systems  Eyes: Negative.   Respiratory: Negative.   Cardiovascular: Negative.   Gastrointestinal: Negative.   Endocrine: Negative.   Genitourinary: Negative.   Neurological: Negative.   Hematological: Negative.     Blood pressure 113/73, pulse 85, temperature 98.4 F (36.9 C), temperature source Oral, resp. rate 20, height 5' 9.25" (1.759 m), weight 52.6 kg, SpO2 97 %.Body mass index is 17.01 kg/m.  General Appearance: Casual  Eye Contact:  None  Speech:  Slow  Volume:  Decreased  Mood:  Irritable  Affect:  Constricted  Thought Process:  Irrelevant  Orientation:  Other:  Presumed person place situation  Thought Content:  Delusions and Hallucinations: Auditory  Suicidal Thoughts:  No  Homicidal Thoughts:  No  Memory:  Immediate;   Poor Recent;   Poor Remote;   Poor  Judgement:  Impaired  Insight:  Lacking  Psychomotor Activity:  Decreased  Concentration:  Concentration: Poor and Attention Span: Poor   Recall:  Poor  Fund of Knowledge:  Poor  Language:  Fair  Akathisia:  Negative  Handed:  Right  AIMS (if indicated):     Assets:  Physical Health Resilience  ADL's:  Intact  Cognition:  WNL  Sleep:  Number of Hours: 9    Treatment Plan Summary: Daily contact with patient to assess and evaluate symptoms and progress in treatment and Medication management  Observation Level/Precautions:  15 minute checks  Laboratory:  UDS  Psychotherapy: Medication and illness education encourage compliance  Medications:    Consultations:    Discharge Concerns: Long-term compliance insight and improvement  Estimated LOS: Days  Other:     Physician Treatment Plan for Primary Diagnosis:   Axis I-schizophrenia paranoid type onset approximately 6 months ago Cannabis dependence Noncompliance with medication Axis II rule out borderline traits versus disorder Axis III medically stable  As discussed long-acting injectable Long Term Goal(s): Improvement in symptoms so as ready for discharge  Short Term Goals: Ability to identify changes in lifestyle to reduce recurrence of condition will improve, Ability to verbalize feelings will improve and Ability to disclose and discuss suicidal ideas  Physician Treatment Plan for Secondary Diagnosis: Active Problems:   Delusional disorder ([place])  Long Term Goal(s): Improvement in symptoms so as ready for discharge  Short Term Goals: Ability to identify changes in lifestyle to reduce recurrence of condition will improve and Ability to verbalize feelings will improve  I certify that inpatient services furnished can reasonably be expected to improve the patient's condition.    Malvin JohnsFARAH,Latrelle Fuston, MD 3/24/20218:09 AM

## 2019-06-01 NOTE — [facility] Suicide Risk Assessment (Signed)
[facility] Admission Suicide Risk Assessment   Nursing information obtained from:  Patient Demographic factors:  Unemployed, Adolescent or young adult, Low socioeconomic status Current Mental Status:  Plan to harm others, Thoughts of violence towards others Loss Factors:  Decline in physical health Historical Factors:  Impulsivity Risk Reduction Factors:  Sense of responsibility to family, Positive social support, Living with another person, especially a relative, Positive therapeutic relationship  Total Time spent with patient: 45 minutes Principal Problem: <principal problem not specified> Diagnosis:  Active Problems:   Delusional disorder (HCC)  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  World Science writer [facility]). Score between 0-7:  no or low risk or alcohol related problems. Score between 8-15:  moderate risk of alcohol related problems. Score between 16-19:  high risk of alcohol related problems. Score 20 or above:  warrants further diagnostic evaluation for alcohol dependence and treatment.   CLINICAL FACTORS:   Schizophrenia:   Less than 47 years old Paranoid or undifferentiated type   Musculoskeletal: Strength & Muscle Tone: within normal limits Gait & Station: normal Patient leans: N/A  Psychiatric Specialty Exam: Physical Exam  Nursing note and vitals reviewed. Constitutional: She appears well-developed and well-nourished.  Cardiovascular: Normal rate and regular rhythm.    Review of Systems  Eyes: Negative.   Respiratory: Negative.   Cardiovascular: Negative.   Gastrointestinal: Negative.   Endocrine: Negative.   Genitourinary: Negative.   Neurological: Negative.   Hematological: Negative.     Blood pressure 113/73, pulse 85, temperature 98.4 F (36.9 C),  temperature source Oral, resp. rate 20, height 5' 9.25" (1.759 m), weight 52.6 kg, SpO2 97 %.Body mass index is 17.01 kg/m.  General Appearance: Casual  Eye Contact:  None  Speech:  Slow  Volume:  Decreased  Mood:  Irritable  Affect:  Constricted  Thought Process:  Irrelevant  Orientation:  Other:  Presumed person place situation  Thought Content:  Delusions and Hallucinations: Auditory  Suicidal Thoughts:  No  Homicidal Thoughts:  No  Memory:  Immediate;   Poor Recent;   Poor Remote;   Poor  Judgement:  Impaired  Insight:  Lacking  Psychomotor Activity:  Decreased  Concentration:  Concentration: Poor and Attention Span: Poor  Recall:  Poor  Fund of Knowledge:  Poor  Language:  Fair  Akathisia:  Negative  Handed:  Right  AIMS (if indicated):     Assets:  Physical Health Resilience  ADL's:  Intact  Cognition:  WNL  Sleep:  Number of Hours: 9    COGNITIVE FEATURES THAT CONTRIBUTE TO RISK:  Loss of executive function    SUICIDE RISK:   Minimal: No identifiable suicidal ideation.  Patients presenting with no risk factors but with morbid ruminations; may be classified as minimal risk based on the severity of the depressive symptoms  PLAN OF CARE: admit  I certify that inpatient services furnished can reasonably be expected to improve the patient's condition.   Malvin Johns, MD 06/01/2019, 8:20 AM

## 2019-06-01 NOTE — Tx Team (Signed)
Interdisciplinary Treatment and Diagnostic Plan Update  06/01/2019 Time of Session: 9:00am Angie Lopez MRN: [id number]  Principal Diagnosis: <principal problem not specified>  Secondary Diagnoses: Active Problems:   Delusional disorder ([place])   Current Medications:  Current Facility-Administered Medications  Medication Dose Route Frequency Provider Last Rate Last Admin  . acetaminophen (TYLENOL) tablet 650 mg  650 mg Oral Q4H PRN Sharma Covert, MD      . benztropine (COGENTIN) tablet 1 mg  1 mg Oral BID Johnn Hai, MD      . FLUoxetine (PROZAC) capsule 20 mg  20 mg Oral Daily Sharma Covert, MD      . haloperidol lactate (HALDOL) injection 10 mg  10 mg Intramuscular Q6H PRN Johnn Hai, MD      . hydrOXYzine (ATARAX/VISTARIL) tablet 25 mg  25 mg Oral TID PRN Sharma Covert, MD      . risperiDONE (RISPERDAL M-TABS) disintegrating tablet 3 mg  3 mg Oral BID Sharma Covert, MD   3 mg at 05/31/19 1808  . temazepam (RESTORIL) capsule 30 mg  30 mg Oral QHS Johnn Hai, MD       PTA Medications: Medications Prior to Admission  Medication Sig Dispense Refill Last Dose  . FLUoxetine (PROZAC) 20 MG capsule Take 1 capsule (20 mg total) by mouth daily. (Patient not taking: Reported on 11/17/2018) 30 capsule 3   . Lumateperone Tosylate (CAPLYTA) 42 MG CAPS Take 42 mg by mouth daily. (Patient not taking: Reported on 01/20/2019) 30 capsule 11   . omega-3 acid ethyl esters (LOVAZA) 1 g capsule Take 1 capsule (1 g total) by mouth 2 (two) times daily. (Patient not taking: Reported on 01/20/2019) 60 capsule 5   . Prenatal Vit-Fe Fumarate-FA (PRENATAL MULTIVITAMIN) TABS tablet Take 1 tablet by mouth daily at 12 noon. (Patient not taking: Reported on 01/20/2019) 90 tablet 1   . temazepam (RESTORIL) 30 MG capsule Take 1 capsule (30 mg total) by mouth at bedtime. (Patient not taking: Reported on 05/27/2019) 30 capsule 0     Patient Stressors: Financial difficulties Marital or family  conflict Medication change or noncompliance Occupational concerns Substance abuse  Patient Strengths: Physical Health Supportive family/friends  Treatment Modalities: Medication Management, Group therapy, Case management,  1 to 1 session with clinician, Psychoeducation, Recreational therapy.   Physician Treatment Plan for Primary Diagnosis: <principal problem not specified> Long Term Goal(s): Improvement in symptoms so as ready for discharge Improvement in symptoms so as ready for discharge   Short Term Goals: Ability to identify changes in lifestyle to reduce recurrence of condition will improve Ability to verbalize feelings will improve Ability to disclose and discuss suicidal ideas Ability to identify changes in lifestyle to reduce recurrence of condition will improve Ability to verbalize feelings will improve  Medication Management: Evaluate patient's response, side effects, and tolerance of medication regimen.  Therapeutic Interventions: 1 to 1 sessions, Unit Group sessions and Medication administration.  Evaluation of Outcomes: Not Met  Physician Treatment Plan for Secondary Diagnosis: Active Problems:   Delusional disorder ([place])  Long Term Goal(s): Improvement in symptoms so as ready for discharge Improvement in symptoms so as ready for discharge   Short Term Goals: Ability to identify changes in lifestyle to reduce recurrence of condition will improve Ability to verbalize feelings will improve Ability to disclose and discuss suicidal ideas Ability to identify changes in lifestyle to reduce recurrence of condition will improve Ability to verbalize feelings will improve     Medication Management: Evaluate patient's  response, side effects, and tolerance of medication regimen.  Therapeutic Interventions: 1 to 1 sessions, Unit Group sessions and Medication administration.  Evaluation of Outcomes: Not Met   RN Treatment Plan for Primary Diagnosis: <principal problem  not specified> Long Term Goal(s): Knowledge of disease and therapeutic regimen to maintain health will improve  Short Term Goals: Ability to verbalize frustration and anger appropriately will improve, Ability to verbalize feelings will improve, Ability to identify and develop effective coping behaviors will improve and Compliance with prescribed medications will improve  Medication Management: RN will administer medications as ordered by provider, will assess and evaluate patient's response and provide education to patient for prescribed medication. RN will report any adverse and/or side effects to prescribing provider.  Therapeutic Interventions: 1 on 1 counseling sessions, Psychoeducation, Medication administration, Evaluate responses to treatment, Monitor vital signs and CBGs as ordered, Perform/monitor CIWA, COWS, AIMS and Fall Risk screenings as ordered, Perform wound care treatments as ordered.  Evaluation of Outcomes: Not Met   LCSW Treatment Plan for Primary Diagnosis: <principal problem not specified> Long Term Goal(s): Safe transition to appropriate next level of care at discharge, Engage patient in therapeutic group addressing interpersonal concerns.  Short Term Goals: Engage patient in aftercare planning with referrals and resources, Increase social support, Identify triggers associated with mental health/substance abuse issues and Increase skills for wellness and recovery  Therapeutic Interventions: Assess for all discharge needs, 1 to 1 time with Social worker, Explore available resources and support systems, Assess for adequacy in community support network, Educate family and significant other(s) on suicide prevention, Complete Psychosocial Assessment, Interpersonal group therapy.  Evaluation of Outcomes: Not Met  Progress in Treatment: Attending groups: No. New to unit. Participating in groups: No. Taking medication as prescribed: Yes. Toleration medication:  Yes. Family/Significant other contact made: No, will contact:  supports if consents are granted. Patient understands diagnosis: No. Discussing patient identified problems/goals with staff: No. Medical problems stabilized or resolved: Yes. Denies suicidal/homicidal ideation: Yes. Issues/concerns per patient self-inventory: No.  New problem(s) identified: No, Describe:  CSW assessing   New Short Term/Long Term Goal(s):  medication management for mood stabilization; elimination of SI thoughts; development of comprehensive mental wellness/sobriety plan.  Patient Goals:  "I'm ready to go home."  Discharge Plan or Barriers: Patient recently admitted to unit, CSW assessing for appropriate referrals.  Reason for Continuation of Hospitalization: Aggression Anxiety Medication stabilization  Estimated Length of Stay: 3-5 days  Attendees: Patient: Angie Lopez 06/01/2019 8:58 AM  Physician: Dr.Farah 06/01/2019 8:58 AM  Nursing:  06/01/2019 8:58 AM  RN Care Manager: 06/01/2019 8:58 AM  Social Worker: Stephanie Acre, Latanya Presser 06/01/2019 8:58 AM  Recreational Therapist:  06/01/2019 8:58 AM  Other:  06/01/2019 8:58 AM  Other:  06/01/2019 8:58 AM  Other: 06/01/2019 8:58 AM    Scribe for Treatment Team: [name], [place] 06/01/2019 8:58 AM

## 2019-06-01 NOTE — Progress Notes (Signed)
   06/01/19 0559  Psych Admission Type (Psych Patients Only)  Admission Status Involuntary  Psychosocial Assessment  Patient Complaints None  Eye Contact Brief  Facial Expression Other (Comment) (pt asleep)  Affect Other (Comment) (pt asleep)  Speech Other (Comment)  Interaction Other (Comment) (asleep)  Motor Activity Other (Comment) (asleep)  Appearance/Hygiene In scrubs  Mood Other (Comment)  Thought Process  Coherency Unable to assess  Content UTA  Delusions UTA  Perception UTA  Hallucination UTA  Judgment UTA  Confusion UTA  Danger to Self  Current suicidal ideation?  (asleep)  Danger to Others  Danger to Others None reported or observed   Pt asleep all shift. Q15 min checks for safety. Pt maintained safely on unit.

## 2019-06-01 NOTE — [facility] Group Notes (Signed)
LCSW Group Therapy Notes 06/01/2019 1:49 PM  Type of Therapy and Topic: Group Therapy: Overcoming Obstacles  Participation Level: Did Not Attend  Description of Group:  In this group patients will be encouraged to explore what they see as obstacles to their own wellness and recovery. They will be guided to discuss their thoughts, feelings, and behaviors related to these obstacles. The group will process together ways to cope with barriers, with attention given to specific choices patients can make. Each patient will be challenged to identify changes they are motivated to make in order to overcome their obstacles. This group will be process-oriented, with patients participating in exploration of their own experiences as well as giving and receiving support and challenge from other group members.  Therapeutic Goals: 1. Patient will identify personal and current obstacles as they relate to admission. 2. Patient will identify barriers that currently interfere with their wellness or overcoming obstacles.  3. Patient will identify feelings, thought process and behaviors related to these barriers. 4. Patient will identify two changes they are willing to make to overcome these obstacles:   Summary of Patient Progress    Therapeutic Modalities:  Cognitive Behavioral Therapy Solution Focused Therapy Motivational Interviewing Relapse Prevention Therapy  Enid Cutter, MSW, [facility] 06/01/2019 1:49 PM

## 2019-06-01 NOTE — Progress Notes (Signed)
Recreation Therapy Notes  Date: 3.24.21 Time: 0950 Location: 500 [address] Dayroom  Group Topic: Wellness  Goal Area(s) Addresses:  Patient will define components of whole wellness. Patient will verbalize benefit of whole wellness.  Intervention: Music   Activity: Exercise.  LRT led patients in a series of stretches to loosen up the muscles.  Each patient then got the opportunity to lead the group in an exercise of their choice.  The group was to get at least 30 minutes of exercise.  Patients were encouraged to take breaks if needed and get water as needed.  Education: Wellness, Building control surveyor.   Education Outcome: Acknowledges education/In group clarification offered/Needs additional education.   Clinical Observations/Feedback: Pt did not attend group session.    Caroll Rancher, LRT/CTRS         Caroll Rancher A 06/01/2019 11:24 AM

## 2019-06-01 NOTE — [facility] Counselor (Signed)
Patient informed CSW "I'm ready to go home today, I'm not going to say anything else."  PSA not completed at this time, CSW will follow up to make a second attempt.  Enid Cutter, MSW, LCSW-A Clinical Social Worker [facility]  [phone]

## 2019-06-02 MED ORDER — HALOPERIDOL LACTATE 5 MG/ML IJ SOLN
10.0000 mg | Freq: Two times a day (BID) | INTRAMUSCULAR | Status: DC | PRN
  Administered 2019-06-02 – 2019-06-06 (×6): 10 mg via INTRAMUSCULAR
  Filled 2019-06-02 (×2): qty 2

## 2019-06-02 MED ORDER — HALOPERIDOL LACTATE 5 MG/ML IJ SOLN
INTRAMUSCULAR | Status: AC
  Filled 2019-06-02: qty 2

## 2019-06-02 MED ORDER — RISPERIDONE 2 MG PO TBDP
4.0000 mg | ORAL_TABLET | Freq: Two times a day (BID) | ORAL | Status: DC
  Administered 2019-06-02 – 2019-06-04 (×4): 4 mg via ORAL
  Filled 2019-06-02 (×12): qty 2

## 2019-06-02 NOTE — Progress Notes (Signed)
Psychoeducational Group Note  Date:  06/02/2019 Time:  2036  Group Topic/Focus:  Wrap-Up Group:   The focus of this group is to help patients review their daily goal of treatment and discuss progress on daily workbooks.  Participation Level: Did Not Attend  Participation Quality:  Not Applicable  Affect:  Not Applicable  Cognitive:  Not Applicable  Insight:  Not Applicable  Engagement in Group: Not Applicable  Additional Comments:  The patient did not attend group.   Hazle Coca S 06/02/2019, 8:36 PM

## 2019-06-02 NOTE — Progress Notes (Signed)
   06/02/19 0447  Psych Admission Type (Psych Patients Only)  Admission Status Involuntary  Psychosocial Assessment  Patient Complaints None  Eye Contact Brief  Facial Expression Anxious  Affect Anxious  Speech Logical/coherent  Interaction Minimal;Guarded;Isolative  Motor Activity Other (Comment) (WNL)  Appearance/Hygiene Unremarkable  Behavior Characteristics Cooperative;Anxious  Mood Anxious  Thought Process  Coherency WDL  Content WDL  Delusions None reported or observed  Perception UTA  Hallucination None reported or observed  Judgment Impaired  Confusion None  Danger to Self  Current suicidal ideation? Denies  Danger to Others  Danger to Others None reported or observed

## 2019-06-02 NOTE — Progress Notes (Signed)
Recreation Therapy Notes  Date: 3.25.21 Time: 0950 Location: 500 Hall Dayroom  Group Topic: Communication, Team Building, Problem Solving  Goal Area(s) Addresses:  Patient will effectively work with peer towards shared goal.  Patient will identify skill used to make activity successful.  Patient will identify how skills used during activity can be used to reach post d/c goals.   Intervention: STEM Activity   Activity: [employer]. Patients were provided the following materials: 5 drinking straws, 5 rubber bands, 5 paper clips, 2 index cards and 2 drinking cups. Using the provided materials patients were asked to build a launching mechanisms to launch a ping pong ball approximately 12 feet. Patients were divided into teams of 3-5.   Education: Pharmacist, community, Building control surveyor.   Education Outcome: Acknowledges education/In group clarification offered/Needs additional education.   Clinical Observations/Feedback: Pt did not attend group.     Caroll Rancher, LRT/CTRS         Lillia Abed, Liah Morr A 06/02/2019 11:03 AM

## 2019-06-02 NOTE — Progress Notes (Signed)
[facility] Second Physician Opinion Progress Note for Medication Administration to Non-consenting Patients (For Involuntarily Committed Patients)  Patient: Angie Lopez Date of Birth: [birth year] MRN: [id number]  Reason for the Medication: The patient, without the benefit of the specific treatment measure, is incapable of participating in any available treatment plan that will give the patient a realistic opportunity of improving the patient's condition.  Consideration of Side Effects: Consideration of the side effects related to the medication plan has been given.  Rationale for Medication Administration: Patient is seen and examined.  Patient is a 19 year old female originally brought to [facility] emergency department on 05/28/2019 secondary to psychotic symptoms.  The patient had been having hallucinations and delusional thinking, and was not taking prescribed medications.  She was admitted to the hospital on 06/01/2019.  It is noted that she had been previously diagnosed with schizophrenia in September 2020.  Evaluation on the unit showed her to be paranoid, agitated and refusing to cooperate.  She has been refusing medications and request for second opinion for forced medications was noted.  On my evaluation today she is irritable, pressured and delusional.  She also appears to be paranoid, and guarded.  She focuses on food, and her weight loss.  Her speech was pressured, and her rational thinking about medications was considered pathological.  After evaluation of the patient I agree with Dr. Jeannine Kitten that forced medications are in the best interest of this patient.  Without this there could significantly be more morbidity, danger to staff and other patients as well as danger to the patient herself.    Antonieta Pert, MD 06/02/19  10:14 AM   This documentation is good for (7) seven days from the date of the MD signature. New documentation must be completed every seven (7) days with  detailed justification in the medical record if the patient requires continued non-emergent administration of psychotropic medications.

## 2019-06-02 NOTE — [facility] Counselor (Signed)
CSW attempted to meet with patient to complete assessment. Patient declined to participate.  This is CSW's second attempt to complete assessment.  Enid Cutter, MSW, LCSW-A Clinical Social Worker [facility]  [phone]

## 2019-06-02 NOTE — Progress Notes (Signed)
Recreation Therapy Notes  3.25.21 1023:  LRT went to patient room to complete assessment.  Patient was awake and declined to complete assessment.  LRT will attempt to complete assessment at a later time.    Caroll Rancher, LRT/CTRS    Lillia Abed, Corby Villasenor A 06/02/2019 11:40 AM

## 2019-06-02 NOTE — Plan of Care (Signed)
Progress note  D: pt found in bed; non compliant with medication administration. Pt would become selectively mute at certain times with assessment. A second opinion was obtained for a force medication order based on the pt and their actions during admission and current. Pt was was given the option again but pt still refused. Pt was compliant with IM medications. Pt has continued to be reclusive to their room. During interactions, pt continues to be argumentative and angry/agitated. Pt denies si/hi/ah/vh and verbally agrees to approach staff if these become apparent or before harming themself/others while at bhh.  A: Pt provided support and encouragement. Pt given medication per protocol and standing orders. Q58m safety checks implemented and continued.  R: Pt safe on the unit. Will continue to monitor.  Pt progressing in the following metrics  Problem: Education: Goal: Ability to state activities that reduce stress will improve Outcome: Not Progressing   Problem: Coping: Goal: Ability to identify and develop effective coping behavior will improve Outcome: Not Progressing   Problem: Self-Concept: Goal: Ability to identify factors that promote anxiety will improve Outcome: Not Progressing Goal: Level of anxiety will decrease Outcome: Not Progressing

## 2019-06-02 NOTE — Progress Notes (Signed)
   06/02/19 2339  Psych Admission Type (Psych Patients Only)  Admission Status Involuntary  Psychosocial Assessment  Patient Complaints None (sleeping)  Eye Contact Brief  Facial Expression Angry;Anxious;Pensive  Affect Angry;Anxious;Preoccupied  Speech Elective mutism  Interaction Avoidant;Forwards little;Guarded;Minimal  Motor Activity Slow  Appearance/Hygiene In scrubs  Behavior Characteristics Other (Comment) (sleeping)  Mood Other (Comment) (sleeping)  Thought Process  Coherency WDL  Content WDL  Delusions None reported or observed  Perception WDL  Hallucination None reported or observed  Judgment Poor  Confusion WDL  Danger to Self  Current suicidal ideation? Denies  Danger to Others  Danger to Others None reported or observed  Danger to Others Abnormal  Harmful Behavior to others No threats or harm toward other people  Destructive Behavior No threats or harm toward property  D: Patient sleeping since begining of shift. respiraration even and unlabored.  A: 15 mins checks for safety  R: Patient remains safe on the unit. Will continue to monitor for safety and stability.

## 2019-06-02 NOTE — Progress Notes (Signed)
[facility] MD Progress Note  06/02/2019 9:56 AM Angie Lopez  MRN:  [id number] Subjective:    Patient remains irritable and argumentative refusing medications did receive IM haloperidol yesterday however she has taken only 1 dose of risperidone.  Again her first break psychosis was elaborated during the last admission in September but she has been noncompliant and abusing cannabis since that discharge.  Again patient hostile irritable argumentative and tone and refusing to cooperate meaningfully with the interview and refusing to give meaningful answers, stating loudly "I am not a patient!" Principal Problem: Schizophrenic disorder Diagnosis: Active Problems:   Delusional disorder (HCC)  Total Time spent with patient: 20 minutes  Past Psychiatric History: see eval  Past Medical History:  Past Medical History:  Diagnosis Date  . Acne 08/2011   epiduo 2012  . ADHD (attention deficit hyperactivity disorder) 2009   HTN on stimulants, off meds 03/2012  . Allergic conjunctivitis 2012  . Allergic rhinitis 08/2010  . Learning disorder 08/2010   borderline IQ, had IEP in 2012  . Obesity 08/13/2010  . Prediabetes    HBA1C 6.0 03/2010, never took metromin in 2012-2014  . SCFE (slipped capital femoral epiphysis) 2011    Past Surgical History:  Procedure Laterality Date  . HIP PINNING  12/2009   Family History:  Family History  Problem Relation Age of Onset  . Obesity Sister   . Obesity Brother   . Hypertension Maternal Grandmother   . Diabetes Maternal Grandmother   . Obesity Maternal Grandmother   . Hypertension Maternal Grandfather   . Obesity Mother   . Diabetes Maternal Aunt   . Obesity Maternal Aunt   . Diabetes Cousin   . Thyroid disease Neg Hx    Family Psychiatric  History: see eval Social History:  Social History   Substance and Sexual Activity  Alcohol Use Never  . Alcohol/week: 0.0 standard drinks     Social History   Substance and Sexual Activity  Drug Use Yes  .  Types: Marijuana, Cocaine   Comment: pt denies current drug use    Social History   Socioeconomic History  . Marital status: Single    Spouse name: Not on file  . Number of children: Not on file  . Years of education: Not on file  . Highest education level: Not on file  Occupational History  . Not on file  Tobacco Use  . Smoking status: Former Smoker    Types: Cigarettes  . Smokeless tobacco: Never Used  Substance and Sexual Activity  . Alcohol use: Never    Alcohol/week: 0.0 standard drinks  . Drug use: Yes    Types: Marijuana, Cocaine    Comment: pt denies current drug use  . Sexual activity: Not Currently    Birth control/protection: Implant    Comment: "taken out about a year ago. Don't need it now"  Other Topics Concern  . Not on file  Social History Narrative   Lives at home with mom and sister, attends [employer] is in 7th grade. [name].    Social Determinants of Health   Financial Resource Strain:   . Difficulty of Paying Living Expenses:   Food Insecurity:   . Worried About Programme researcher, broadcasting/film/video in the Last Year:   . Barista in the Last Year:   Transportation Needs:   . Freight forwarder (Medical):   Marland Kitchen Lack of Transportation (Non-Medical):   Physical Activity:   . Days of Exercise per Week:   .  Minutes of Exercise per Session:   Stress:   . Feeling of Stress :   Social Connections:   . Frequency of Communication with Friends and Family:   . Frequency of Social Gatherings with Friends and Family:   . Attends Religious Services:   . Active Member of Clubs or Organizations:   . Attends Archivist Meetings:   Marland Kitchen Marital Status:    Additional Social History:                         Sleep: Fair  Appetite:  Fair  Current Medications: Current Facility-Administered Medications  Medication Dose Route Frequency Provider Last Rate Last Admin  . acetaminophen (TYLENOL) tablet 650 mg  650 mg Oral Q4H PRN Sharma Covert, MD      . benztropine (COGENTIN) tablet 1 mg  1 mg Oral BID Johnn Hai, MD      . FLUoxetine (PROZAC) capsule 20 mg  20 mg Oral Daily Sharma Covert, MD      . haloperidol lactate (HALDOL) injection 10 mg  10 mg Intramuscular Q6H PRN Johnn Hai, MD   10 mg at 06/01/19 0919  . hydrOXYzine (ATARAX/VISTARIL) tablet 25 mg  25 mg Oral TID PRN Sharma Covert, MD      . risperiDONE (RISPERDAL M-TABS) disintegrating tablet 3 mg  3 mg Oral BID Sharma Covert, MD   3 mg at 05/31/19 1808  . temazepam (RESTORIL) capsule 30 mg  30 mg Oral QHS Johnn Hai, MD        Lab Results: No results found for this or any previous visit (from the past 48 hour(s)).  Blood Alcohol level:  Lab Results  Component Value Date   ETH <10 05/27/2019   ETH <10 42/59/5638    Metabolic Disorder Labs: Lab Results  Component Value Date   HGBA1C 5.0 11/19/2018   MPG 97 11/19/2018   Lab Results  Component Value Date   PROLACTIN 44.1 (H) 11/19/2018   Lab Results  Component Value Date   CHOL 170 11/19/2018   TRIG 50 11/19/2018   HDL 49 11/19/2018   CHOLHDL 3.5 11/19/2018   VLDL 10 11/19/2018   LDLCALC 111 (H) 11/19/2018    Physical Findings: AIMS: Facial and Oral Movements Muscles of Facial Expression: None, normal Lips and Perioral Area: None, normal Jaw: None, normal Tongue: None, normal,Extremity Movements Upper (arms, wrists, hands, fingers): None, normal Lower (legs, knees, ankles, toes): None, normal, Trunk Movements Neck, shoulders, hips: None, normal, Overall Severity Severity of abnormal movements (highest score from questions above): None, normal Incapacitation due to abnormal movements: None, normal Patient's awareness of abnormal movements (rate only patient's report): No Awareness, Dental Status Current problems with teeth and/or dentures?: No Does patient usually wear dentures?: No  CIWA:    COWS:     Musculoskeletal: Strength & Muscle Tone: within normal  limits Gait & Station: normal Patient leans: N/A  Psychiatric Specialty Exam: Physical Exam  Review of Systems  Blood pressure 113/73, pulse 85, temperature 98.4 F (36.9 C), temperature source Oral, resp. rate 20, height 5' 9.25" (1.759 m), weight 52.6 kg, SpO2 97 %.Body mass index is 17.01 kg/m.  General Appearance: Disheveled  Eye Contact:  Good  Speech:  Pressured  Volume:  Increased  Mood:  Angry and Irritable  Affect:  Congruent  Thought Process:  Irrelevant  Orientation:  Other:  We will not answer meaningfully  Thought Content:  Illogical and Delusions  Suicidal Thoughts:  No  Homicidal Thoughts:  No  Memory:  Immediate;   Poor Recent;   Poor Remote;   Poor  Judgement:  Poor  Insight:  Lacking  Psychomotor Activity:  Decreased  Concentration:  Concentration: Poor and Attention Span: Fair  Recall:  Poor  Fund of Knowledge:  Poor  Language:  Poor  Akathisia:  Negative  Handed:  Right  AIMS (if indicated):     Assets:  Communication Skills Desire for Improvement  ADL's:  Intact  Cognition:  WNL  Sleep:  Number of Hours: 6.75     Treatment Plan Summary: Daily contact with patient to assess and evaluate symptoms and progress in treatment, Medication management and Plan Continue to monitor on 15-minute checks consultation guarding force medications as patient refusing medications at this point in time  [facility], MD 06/02/2019, 9:56 AM

## 2019-06-02 NOTE — [facility] Group Notes (Signed)
[facility] Group Therapy 06/02/2019 2:36 PM  Type of Therapy: Mental Health Association Presentation  Participation Level: Did not attend.   Modes of Intervention: Discussion, Education and Socialization   Summary of Progress/Problems: Mental Health Association (MHA) Speaker came to talk about his personal journey with mental health. The pt processed ways by which to relate to the speaker. MHA speaker provided handouts and educational information pertaining to groups and services offered by the MHA. Pt was engaged in speaker's presentation and was receptive to resources provided.   Aurelie Dicenzo, MSW, LCSWA 06/02/2019 2:36 PM 

## 2019-06-02 NOTE — [facility] Counselor (Signed)
Adult Comprehensive Assessment  Patient ID: Angie Lopez, female   DOB: [birth year], 19 y.o.   MRN: [id number]  Information Source: Information source: Patient  Current Stressors: Patient states their primary concerns and needs for treatment are:: "I'm not sure." IVC paperwork reports, "Respondent is diagnosed as having psychosis. Is not taking her prescribed medication. Is hostile and aggressive towards others. Is having hallucinations claiming her mother has kidnapped her and she saw her mother kill a child etc. Is smoking marijuana daily." Patient states their goals for this hospitilization and ongoing recovery are:: "I'm ready to go home." Educational / Learning stressors: Denies, not in school. Employment / Job issues: Denies, not working. Family Relationships: "I ignore them." Financial / Lack of resources (include bankruptcy): No income, has insurance. Housing / Lack of housing: Denies, reports she lives alone. Physical health (include injuries & life threatening diseases): Denies Social relationships: Denies, is single. "I stay to myself." Substance abuse: Denies. Chart review indicates THC use. Bereavement / Loss: Patient denies  Living/Environment/Situation: Living Arrangements: Reports she lives alone in [place] Living conditions (as described by patient or guardian): "I stay by myself. Its fine." Who else lives in the home?: Alone How long has patient lived in current situation?: Unknown What is atmosphere in current home: Comfortable  Family History: Marital status: Single Are you sexually active?: No What is your sexual orientation?: Heterosexual Has your sexual activity been affected by drugs, alcohol, medication, or emotional stress?: pt denies Does patient have children?: No  Childhood History: By whom was/is the patient raised?: Father Additional childhood history information: Mom and dad are separated. They met other people Description of patient's  relationship with caregiver when they were a child: "spoiled and he does things for me" Patient's description of current relationship with people who raised him/her: Strained. How were you disciplined when you got in trouble as a child/adolescent?: "time out and whoppings" Does patient have siblings?: Yes Number of Siblings: 4(2 sisters and 2 brothers) Description of patient's current relationship with siblings: Strained. Did patient suffer any verbal/emotional/physical/sexual abuse as a child?: No Did patient suffer from severe childhood neglect?: No Has patient ever been sexually abused/assaulted/raped as an adolescent or adult?: No ("someone tried to") Was the patient ever a victim of a crime or a disaster?: No Witnessed domestic violence?: No Has patient been effected by domestic violence as an adult?: No  Education: Highest grade of school patient has completed: 12th grade Currently a student?: No Learning disability?: No  Employment/Work Situation: Employment situation: Unemployed Patient's job has been impacted by current illness: No What is the longest time patient has a held a job?: "5-6 months" Where was the patient employed at that time?: "[place]" Did You Receive Any Psychiatric Treatment/Services While in the [employer]?: No Are There Guns or Other Weapons in [place]?: No  Financial Resources: Museum/gallery curator resources: No income, has insurance Does patient have a Programmer, applications or guardian?: No  Alcohol/Substance Abuse: What has been your use of drugs/alcohol within the last 12 months?: Marijuana If attempted suicide, did drugs/alcohol play a role in this?: No Alcohol/Substance Abuse Treatment Hx: Denies past history Has alcohol/substance abuse ever caused legal problems?: No  Social Support System: Heritage manager System: Poor Describe Community Support System: Sometimes her parents Type of faith/religion: Muslim How does  patient's faith help to cope with current illness?: "yes"  Leisure/Recreation: Leisure and Hobbies: "I like art, going to park, reading books, researching things I did not know"  Strengths/Needs: What is the  patient's perception of their strengths?: "I have a good heart and can take care of other people" "To get up every morning and take care of myself" Patient states they can use these personal strengths during their treatment to contribute to their recovery: "do something with it" Patient states these barriers may affect/interfere with their treatment: pt denies Patient states these barriers may affect their return to the community: pt denies  Discharge Plan: Currently receiving community mental health services: Reports she is established with [facility] but has missed her appointments. Patient states concerns and preferences for aftercare planning are: Agreeable to follow up with [facility] Patient states they will know when they are safe and ready for discharge when: Feels ready now. Does patient have access to transportation?: Yes Does patient have financial barriers related to discharge medications?: No Will patient be returning to same living situation after discharge?: Yes  Summary/Recommendations:   Summary and Recommendations (to be completed by the evaluator): Angie Lopez is a 19 year old female that presents under IVC. IVC paperwork reports, "Respondent is diagnosed as having psychosis. Is not taking her prescribed medication. Is hostile and aggressive towards others. Is having hallucinations claiming her mother has kidnapped her and she saw her mother kill a child etc. Is smoking marijuana daily." Patient was last inpatient at [facility] in September 2020. Recommendations for pt include: crisis stabilization, therapeutic milieu, medication management, attend and participate in group therapy, and development of comprehensive mental wellness plan.  Angie Lopez. 06/02/2019

## 2019-06-02 NOTE — [facility] Suicide Risk Assessment (Signed)
[facility] INPATIENT:  Family/Significant Other Suicide Prevention Education  Suicide Prevention Education:  Contact Attempts: mother, [name] [phone] has been identified by the patient as the family member/significant other with whom the patient will be residing, and identified as the person(s) who will aid the patient in the event of a mental health crisis.  With written consent from the patient, two attempts were made to provide suicide prevention education, prior to and/or following the patient's discharge.  We were unsuccessful in providing suicide prevention education.  A suicide education pamphlet was given to the patient to share with family/significant other.  Date and time of first attempt: 06/02/2019 at 3:22pm. CSW left a HIPAA compliance voicemail with callback information. Date and time of second attempt: needs to be attempted  Angie Lopez 06/02/2019, 3:25 PM

## 2019-06-03 DIAGNOSIS — F1220 Cannabis dependence, uncomplicated: Secondary | ICD-10-CM

## 2019-06-03 DIAGNOSIS — F29 Unspecified psychosis not due to a substance or known physiological condition: Secondary | ICD-10-CM

## 2019-06-03 MED ORDER — TEMAZEPAM 15 MG PO CAPS
15.0000 mg | ORAL_CAPSULE | Freq: Every day | ORAL | Status: DC
  Filled 2019-06-03: qty 1

## 2019-06-03 MED ORDER — ENSURE ENLIVE PO LIQD
237.0000 mL | Freq: Two times a day (BID) | ORAL | Status: DC
  Administered 2019-06-03 – 2019-06-04 (×2): 237 mL via ORAL

## 2019-06-03 NOTE — Progress Notes (Signed)
Adult Psychoeducational Group Note  Date:  06/03/2019 Time:  11:17 PM  Group Topic/Focus:  Wrap-Up Group:   The focus of this group is to help patients review their daily goal of treatment and discuss progress on daily workbooks.  Participation Level:  Did Not Attend  Participation Quality:  Did Not Attend  Affect:  Did Not Attend  Cognitive:  Did Not Attend  Insight: None  Engagement in Group:  Did Not Attend  Modes of Intervention:  Did Not Attend  Additional Comments:  Pt did not attend evening wrap up group tonight.  Felipa Furnace 06/03/2019, 11:17 PM

## 2019-06-03 NOTE — Progress Notes (Signed)
   06/03/19 2200  Psych Admission Type (Psych Patients Only)  Admission Status Involuntary  Psychosocial Assessment  Patient Complaints None  Eye Contact Brief  Facial Expression Anxious;Pensive  Affect Appropriate to circumstance  Speech Logical/coherent;Soft  Interaction Cautious;Forwards little;Minimal  Motor Activity Slow  Appearance/Hygiene In scrubs  Behavior Characteristics Cooperative  Mood Suspicious;Labile;Anxious  Aggressive Behavior  Effect No apparent injury  Thought Process  Coherency WDL  Content WDL  Delusions None reported or observed  Perception WDL  Hallucination None reported or observed  Judgment Poor  Confusion WDL  Danger to Self  Current suicidal ideation? Denies  Danger to Others  Danger to Others None reported or observed  Danger to Others Abnormal  Harmful Behavior to others No threats or harm toward other people  Destructive Behavior No threats or harm toward property

## 2019-06-03 NOTE — Progress Notes (Signed)
[facility] MD Progress Note  06/03/2019 10:12 AM Angie Lopez  MRN:  [id number] Subjective: Patient reports she is "all right".  Currently minimizes/denies depression.  She states she is frustrated as she does not feel that she needs to be in the hospital but states that she is making the best of the situation and that he has been interacting with peers.  Currently denies medication side effects.  Denies suicidal ideations.  Objective : I have discussed case with treatment team and have met with patient.  19 year old female, presented to ED under IVC on 3/19 , describing psychotic symptoms, aggression, hostility. Using cannabis daily. Patient presented combative , agitated in ED. he has had past psychiatric history and had been admitted in September 2020 with new onset psychosis, possible schizophrenia.  At the time was treated/stabilized with Caplyta and Prozac.  She had not been taking her psychiatric medications prior to admission and was using cannabis regularly/daily.  Chart notes the patient initially presented paranoid, irritable, selectively mute/not cooperative.  Today patient is alert, attentive, vaguely guarded at first but generally polite and cooperative during session.  Currently irritable or agitated.  No psychomotor agitation or restlessness is noted.  Her affect tends to improve partially during session.  As above, reports frustration that she is hospitalized, but reports she is trying to make " the best of it".  He denies suicidal or self-injurious ideations, also denies any homicidal ideations. Denies medication side effects-note that Dr. Mallie Darting documented a second physician opinion regarding medication demonstration for nonconsenting patient yesterday. Limited group participation. 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 Acne 08/2011   epiduo 2012  . ADHD (attention deficit hyperactivity disorder) 2009   HTN on stimulants, off meds 03/2012  . Allergic conjunctivitis 2012  . Allergic rhinitis 08/2010  . Learning disorder 08/2010   borderline IQ, had IEP in 2012  . Obesity 08/13/2010  . Prediabetes    HBA1C 6.0 03/2010, never took metromin in 2012-2014  . SCFE (slipped capital femoral epiphysis) 2011    Past Surgical History:  Procedure Laterality Date  . HIP PINNING  12/2009   Family History:  Family History  Problem Relation Age of Onset  . Obesity Sister   . Obesity Brother   . Hypertension Maternal Grandmother   . Diabetes Maternal Grandmother   . Obesity Maternal Grandmother   . Hypertension Maternal Grandfather   . Obesity Mother   . Diabetes Maternal Aunt   . Obesity Maternal Aunt   . Diabetes Cousin   . Thyroid disease Neg Hx    Family Psychiatric  History: see eval Social History:  Social History   Substance and Sexual Activity  Alcohol Use Never  . Alcohol/week: 0.0 standard drinks     Social History   Substance and Sexual Activity  Drug Use Yes  . Types: Marijuana, Cocaine   Comment: pt denies current drug use    Social History   Socioeconomic History  . Marital status: Single    Spouse name: Not on file  . Number of children: Not on file  . Years of education: Not on file  . Highest education level: Not on file  Occupational History  . Not on file  Tobacco Use  .  Smoking status: Former Smoker    Types: Cigarettes  . Smokeless tobacco: Never Used  Substance and Sexual Activity  . Alcohol use: Never    Alcohol/week: 0.0 standard drinks  . Drug use: Yes    Types: Marijuana, Cocaine    Comment: pt denies current drug use  . Sexual activity: Not Currently    Birth control/protection: Implant    Comment: "taken out about a year ago.  Don't need it now"  Other Topics Concern  . Not on file  Social History Narrative   Lives at home with mom and sister, attends [employer] is in 7th grade. [employer].    Social Determinants of Health   Financial Resource Strain:   . Difficulty of Paying Living Expenses:   Food Insecurity:   . Worried About Charity fundraiser in the Last Year:   . Arboriculturist in the Last Year:   Transportation Needs:   . Film/video editor (Medical):   Marland Kitchen Lack of Transportation (Non-Medical):   Physical Activity:   . Days of Exercise per Week:   . Minutes of Exercise per Session:   Stress:   . Feeling of Stress :   Social Connections:   . Frequency of Communication with Friends and Family:   . Frequency of Social Gatherings with Friends and Family:   . Attends Religious Services:   . Active Member of Clubs or Organizations:   . Attends Archivist Meetings:   Marland Kitchen Marital Status:    Additional Social History:   Sleep: Improving  Appetite:  Improving  Current Medications: Current Facility-Administered Medications  Medication Dose Route Frequency Provider Last Rate Last Admin  . acetaminophen (TYLENOL) tablet 650 mg  650 mg Oral Q4H PRN Sharma Covert, MD      . benztropine (COGENTIN) tablet 1 mg  1 mg Oral BID Johnn Hai, MD   1 mg at 06/03/19 0816  . FLUoxetine (PROZAC) capsule 20 mg  20 mg Oral Daily Sharma Covert, MD   20 mg at 06/03/19 0816  . haloperidol lactate (HALDOL) injection 10 mg  10 mg Intramuscular BID PRN Johnn Hai, MD   10 mg at 06/02/19 1146  . hydrOXYzine (ATARAX/VISTARIL) tablet 25 mg  25 mg Oral TID PRN Sharma Covert, MD      . risperiDONE (RISPERDAL M-TABS) disintegrating tablet 4 mg  4 mg Oral BID Johnn Hai, MD   4 mg at 06/03/19 0815  . temazepam (RESTORIL) capsule 30 mg  30 mg Oral QHS Johnn Hai, MD        Lab Results: No results found for this or any previous visit (from the past 48 hour(s)).  Blood Alcohol level:  Lab  Results  Component Value Date   ETH <10 05/27/2019   ETH <10 18/29/9371    Metabolic Disorder Labs: Lab Results  Component Value Date   HGBA1C 5.0 11/19/2018   MPG 97 11/19/2018   Lab Results  Component Value Date   PROLACTIN 44.1 (H) 11/19/2018   Lab Results  Component Value Date   CHOL 170 11/19/2018   TRIG 50 11/19/2018   HDL 49 11/19/2018   CHOLHDL 3.5 11/19/2018   VLDL 10 11/19/2018   LDLCALC 111 (H) 11/19/2018    Physical Findings: AIMS: Facial and Oral Movements Muscles of Facial Expression: None, normal Lips and Perioral Area: None, normal Jaw: None, normal Tongue: None, normal,Extremity Movements Upper (arms, wrists, hands, fingers): None, normal Lower (legs, knees, ankles, toes):  None, normal, Trunk Movements Neck, shoulders, hips: None, normal, Overall Severity Severity of abnormal movements (highest score from questions above): None, normal Incapacitation due to abnormal movements: None, normal Patient's awareness of abnormal movements (rate only patient's report): No Awareness, Dental Status Current problems with teeth and/or dentures?: No Does patient usually wear dentures?: No  CIWA:    COWS:     Musculoskeletal: Strength & Muscle Tone: within normal limits-no psychomotor agitation or restlessness Gait & Station: normal Patient leans: N/A  Psychiatric Specialty Exam: Physical Exam  Review of Systems make, no chest pain, no shortness of breath at room air, no vomiting  Blood pressure 113/73, pulse 85, temperature 98.4 F (36.9 C), temperature source Oral, resp. rate 20, height 5' 9.25" (1.759 m), weight 52.6 kg, SpO2 97 %.Body mass index is 17.01 kg/m.  General Appearance: Fairly Groomed  Eye Contact:  Good  Speech:  Normal Rate  Volume:  Normal  Mood: Reports feeling "frustrated" but states mood is "okay"  Affect:  Improving, today not presenting overtly irritable or angry  Thought Process:  Linear  Orientation:  Other:  Fully alert and  attentive  Thought Content:  Today he denies hallucinations and does not currently present internally preoccupied.  No overt delusions are currently expressed  Suicidal Thoughts:  No this time denies suicidal plan or intention, contracts for safety on unit  Homicidal Thoughts:  No denies homicidal or violent ideations  Memory:  Recent and remote fair  Judgement:  Fair  Insight:  Fair  Psychomotor Activity:  No psychomotor agitation or restlessness, presents calm and comfortable  Concentration:  Concentration: Poor and Attention Span: Fair  Recall:  [employer] of Knowledge:  Fair  Language:  Fair  Akathisia:  Negative  Handed:  Right  AIMS (if indicated):     Assets:  Communication Skills Desire for Improvement  ADL's:  Intact  Cognition:  WNL  Sleep:  Number of Hours: 6.25   Assessment:  19 year old female, presented to ED under IVC on 3/19 , describing psychotic symptoms, aggression, hostility. Using cannabis daily. Patient presented combative , agitated in ED. he has had past psychiatric history and had been admitted in September 2020 with new onset psychosis, possible schizophrenia.  At the time was treated/stabilized with Caplyta and Prozac.  She had not been taking her psychiatric medications prior to admission and was using cannabis regularly/daily.  Chart notes the patient initially presented paranoid, irritable, selectively mute/not cooperative.  Today patient presents with some improvement compared to her initial presentation.  Also reports feeling "frustrated" about being in the hospital, at this time her affect is not particularly irritable or angry, her behavior is in good control.  Denies SI, presents future oriented, stating (that she is looking forward to obtaining her driver's license eventually being able to drive.  Tolerating medications well thus far. Reports weight loss over weeks prior to this admission.  Admission BMI low at 17.01.  Interested in Ensure supplements to  help regain weight.  (*3/19 BMP unremarkable)   Treatment Plan Summary: Daily contact with patient to assess and evaluate symptoms and progress in treatment, Medication management and Plan Continue to monitor on 15-minute checks consultation guarding force medications as patient refusing medications at this point in time Encourage group and milieu participation Encourage efforts to work on sobriety and relapse prevention.  We have reviewed the negative impact that cannabis use disorder can have on mental health, have encouraged her to consider abstinence Continue Risperidone 4 mg twice  daily for psychosis Continue Cogentin 1 mg twice daily to prevent/manage EPS Continue Prozac 20 mg daily for depression, anxiety Decrease Restoril from 30 mg down to 50 mg nightly as needed for insomnia Ensure supplement ordered  Check routine labs-hemoglobin A1c, lipid panel, TSH Continue working on disposition planning options Jenne Campus, MD 06/03/2019, 10:12 AM   Patient ID: Angie Lopez, female   DOB: Dec 14, 2000, 19 y.o.   MRN: [id number]

## 2019-06-03 NOTE — [facility] Group Notes (Signed)
LCSW Group Therapy Note  06/03/2019 10:29 AM  Type of Therapy and Topic:  Group Therapy:  Feelings around Relapse and Recovery  Participation Level:  Did Not Attend   Description of Group:    Patients in this group will discuss emotions they experience before and after a relapse. They will process how experiencing these feelings, or avoidance of experiencing them, relates to having a relapse. Facilitator will guide patients to explore emotions they have related to recovery. Patients will be encouraged to process which emotions are more powerful. They will be guided to discuss the emotional reaction significant others in their lives may have to their relapse or recovery. Patients will be assisted in exploring ways to respond to the emotions of others without this contributing to a relapse.  Therapeutic Goals: 1. Patient will identify two or more emotions that lead to a relapse for them 2. Patient will identify two emotions that result when they relapse 3. Patient will identify two emotions related to recovery 4. Patient will demonstrate ability to communicate their needs through discussion and/or role plays   Summary of Patient Progress: x    Therapeutic Modalities:   Cognitive Behavioral Therapy Solution-Focused Therapy Assertiveness Training Relapse Prevention Therapy   Iris Pert, MSW, LCSW Clinical Social Work 06/03/2019 10:29 AM

## 2019-06-03 NOTE — Progress Notes (Signed)
   06/03/19 1410  Psych Admission Type (Psych Patients Only)  Admission Status Involuntary  Psychosocial Assessment  Patient Complaints None  Eye Contact Brief  Facial Expression Anxious;Pensive  Affect Appropriate to circumstance  Speech Logical/coherent;Soft  Interaction Cautious;Forwards little;Minimal  Motor Activity Slow  Appearance/Hygiene In scrubs  Behavior Characteristics Cooperative  Mood Suspicious;Anxious;Labile  Aggressive Behavior  Effect No apparent injury  Thought Process  Coherency WDL  Content WDL  Delusions None reported or observed  Perception WDL  Hallucination None reported or observed  Judgment Poor  Confusion WDL  Danger to Self  Current suicidal ideation? Denies  Danger to Others  Danger to Others None reported or observed  Danger to Others Abnormal  Harmful Behavior to others No threats or harm toward other people  Destructive Behavior No threats or harm toward property

## 2019-06-03 NOTE — [facility] Suicide Risk Assessment (Signed)
[facility] INPATIENT:  Family/Significant Other Suicide Prevention Education  Suicide Prevention Education:  Education Completed; with mother, Angie Lopez [phone] has been identified by the patient as the family member/significant other with whom the patient will be residing, and identified as the person(s) who will aid the patient in the event of a mental health crisis (suicidal ideations/suicide attempt).  With written consent from the patient, the family member/significant other has been provided the following suicide prevention education, prior to the and/or following the discharge of the patient.  The suicide prevention education provided includes the following:  Suicide risk factors  Suicide prevention and interventions  National Suicide Hotline telephone number  [facility] assessment telephone number  [facility] Emergency Assistance 911  [facility] and/or Residential Mobile Crisis Unit telephone number  Request made of family/significant other to:  Remove weapons (e.g., guns, rifles, knives), all items previously/currently identified as safety concern.    Remove drugs/medications (over-the-counter, prescriptions, illicit drugs), all items previously/currently identified as a safety concern.  The family member/significant other verbalizes understanding of the suicide prevention education information provided.  The family member/significant other agrees to remove the items of safety concern listed above.  Mother reports her main concern is that the patient is reestablished on medications and is more stable. Mother states the patient has called her daily, asking to return home, mother is aware patient received forced medications yesterday due to non-compliance and notes that patient sounds "better" today.  Mother endorses that patient has been non-compliant with medications for the past several weeks and patient has missed her outpatient medication management  appointments.   CSW reviewed patient's follow up appointments with mother, mother reports she will be with patient on the day of patient's hospital discharge appointment on 03/31.  Mother thanked CSW for the call, no other questions or concerns at this time. Mother is agreeable to patient returning home at discharge.  Angie Lopez 06/03/2019, 1:25 PM

## 2019-06-03 NOTE — Progress Notes (Signed)
Recreation Therapy Notes  Patient admitted to unit 3.23.21. Due to admission within last year, no new assessment conducted at this time. Last assessment conducted 9.10.20. Patient reports being IVC'd and body slammed by the police as a stressor.  Patient stated reason for admission was an argument.  Patient states coping skills are still isolation, journal, tv, sports, music, exercise, meditate, deep breathing, art, prayer, avoidance, read, dance and hot bath/shower.  Patient also states leisure interests are family and work.  Patient stated strengths continue to be helping others, [place]/clean and supportive.  Patient identified areas of improvement as being independent with self.   Patient denies SI, HI, AVH at this time. Patient reports goal of to "feel normal and get out of here normally".      Caroll Rancher, LRT/CTRS    Caroll Rancher A 06/03/2019 11:29 AM

## 2019-06-04 DIAGNOSIS — F200 Paranoid schizophrenia: Secondary | ICD-10-CM

## 2019-06-04 MED ORDER — TRAZODONE HCL 100 MG PO TABS: 100.0000 mg | ORAL_TABLET | Freq: Every evening | ORAL | Status: DC | PRN

## 2019-06-04 MED ORDER — RISPERIDONE MICROSPHERES ER 37.5 MG IM SRER
37.5000 mg | INTRAMUSCULAR | Status: DC
  Administered 2019-06-04: 37.5 mg via INTRAMUSCULAR
  Filled 2019-06-04: qty 2

## 2019-06-04 NOTE — Progress Notes (Signed)
[facility] MD Progress Note  06/04/2019 11:45 AM Angie Lopez  MRN:  [id number] Subjective: Patient is a 19 year old female with a past psychiatric history significant for probable schizophrenia who was admitted on 06/01/2019 under involuntary commitment secondary to delusional beliefs, physically combatively fighting with [facility] police and security as well.  She was noted to be significantly paranoid upon evaluation at the hospital.  Objective: Patient is seen and examined.  Patient is a 19 year old female with the above-stated past psychiatric history who is seen in follow-up.  Patient is familiar to me from an evaluation for forced medication protocol on 06/02/2019.  From a behavioral standpoint the patient is essentially unchanged from when I saw her.  She continues to state that she does not require medication, and that she is taking the medicine because of "someone else".  She stated "that is just the way the world works".  They are giving her the forced medication protocol.  There is a note in the chart from social work who stated that they had spoken to her mother, and that she had sounded "better".  Nursing stated she is taking her oral Risperdal, but I suspect that she will be noncompliant with medicines after discharge.  Given the fact that she has a forced medication protocol at this point, I am going to order the long-acting Risperdal injection today.  Her vital signs are stable, she is afebrile.  Only slept 3 hours last night.  Review of her laboratories revealed a mildly low potassium on admission, and drug screen positive for marijuana.  Beta-hCG was negative.  They have been unable to get an EKG on her.  Principal Problem: <principal problem not specified> Diagnosis: Active Problems:   Delusional disorder (HCC)  Total Time spent with patient: 20 minutes  Past Psychiatric History: Admission H&P  Past Medical History:  Past Medical History:  Diagnosis Date  . Acne 08/2011   epiduo 2012   . ADHD (attention deficit hyperactivity disorder) 2009   HTN on stimulants, off meds 03/2012  . Allergic conjunctivitis 2012  . Allergic rhinitis 08/2010  . Learning disorder 08/2010   borderline IQ, had IEP in 2012  . Obesity 08/13/2010  . Prediabetes    HBA1C 6.0 03/2010, never took metromin in 2012-2014  . SCFE (slipped capital femoral epiphysis) 2011    Past Surgical History:  Procedure Laterality Date  . HIP PINNING  12/2009   Family History:  Family History  Problem Relation Age of Onset  . Obesity Sister   . Obesity Brother   . Hypertension Maternal Grandmother   . Diabetes Maternal Grandmother   . Obesity Maternal Grandmother   . Hypertension Maternal Grandfather   . Obesity Mother   . Diabetes Maternal Aunt   . Obesity Maternal Aunt   . Diabetes Cousin   . Thyroid disease Neg Hx    Family Psychiatric  History: See admission H&P Social History:  Social History   Substance and Sexual Activity  Alcohol Use Never  . Alcohol/week: 0.0 standard drinks     Social History   Substance and Sexual Activity  Drug Use Yes  . Types: Marijuana, Cocaine   Comment: pt denies current drug use    Social History   Socioeconomic History  . Marital status: Single    Spouse name: Not on file  . Number of children: Not on file  . Years of education: Not on file  . Highest education level: Not on file  Occupational History  . Not on file  Tobacco Use  . Smoking status: Former Smoker    Types: Cigarettes  . Smokeless tobacco: Never Used  Substance and Sexual Activity  . Alcohol use: Never    Alcohol/week: 0.0 standard drinks  . Drug use: Yes    Types: Marijuana, Cocaine    Comment: pt denies current drug use  . Sexual activity: Not Currently    Birth control/protection: Implant    Comment: "taken out about a year ago. Don't need it now"  Other Topics Concern  . Not on file  Social History Narrative   Lives at home with mom and sister, attends [employer] is in 7th  grade. [name].    Social Determinants of Health   Financial Resource Strain:   . Difficulty of Paying Living Expenses:   Food Insecurity:   . Worried About Programme researcher, broadcasting/film/video in the Last Year:   . Barista in the Last Year:   Transportation Needs:   . Freight forwarder (Medical):   Marland Kitchen Lack of Transportation (Non-Medical):   Physical Activity:   . Days of Exercise per Week:   . Minutes of Exercise per Session:   Stress:   . Feeling of Stress :   Social Connections:   . Frequency of Communication with Friends and Family:   . Frequency of Social Gatherings with Friends and Family:   . Attends Religious Services:   . Active Member of Clubs or Organizations:   . Attends Banker Meetings:   Marland Kitchen Marital Status:    Additional Social History:                         Sleep: Poor  Appetite:  Fair  Current Medications: Current Facility-Administered Medications  Medication Dose Route Frequency Provider Last Rate Last Admin  . acetaminophen (TYLENOL) tablet 650 mg  650 mg Oral Q4H PRN Antonieta Pert, MD      . benztropine (COGENTIN) tablet 1 mg  1 mg Oral BID Malvin Johns, MD   1 mg at 06/04/19 0803  . feeding supplement (ENSURE ENLIVE) (ENSURE ENLIVE) liquid 237 mL  237 mL Oral BID BM Cobos, Rockey Situ, MD   237 mL at 06/03/19 1425  . FLUoxetine (PROZAC) capsule 20 mg  20 mg Oral Daily Antonieta Pert, MD   20 mg at 06/04/19 0802  . haloperidol lactate (HALDOL) injection 10 mg  10 mg Intramuscular BID PRN Malvin Johns, MD   10 mg at 06/02/19 1146  . hydrOXYzine (ATARAX/VISTARIL) tablet 25 mg  25 mg Oral TID PRN Antonieta Pert, MD      . risperiDONE (RISPERDAL M-TABS) disintegrating tablet 4 mg  4 mg Oral BID Malvin Johns, MD   4 mg at 06/04/19 0803  . temazepam (RESTORIL) capsule 15 mg  15 mg Oral QHS Cobos, Rockey Situ, MD        Lab Results: No results found for this or any previous visit (from the past 48 hour(s)).  Blood Alcohol  level:  Lab Results  Component Value Date   ETH <10 05/27/2019   ETH <10 11/19/2018    Metabolic Disorder Labs: Lab Results  Component Value Date   HGBA1C 5.0 11/19/2018   MPG 97 11/19/2018   Lab Results  Component Value Date   PROLACTIN 44.1 (H) 11/19/2018   Lab Results  Component Value Date   CHOL 170 11/19/2018   TRIG 50 11/19/2018   HDL 49 11/19/2018  CHOLHDL 3.5 11/19/2018   VLDL 10 11/19/2018   LDLCALC 111 (H) 11/19/2018    Physical Findings: AIMS: Facial and Oral Movements Muscles of Facial Expression: None, normal Lips and Perioral Area: None, normal Jaw: None, normal Tongue: None, normal,Extremity Movements Upper (arms, wrists, hands, fingers): None, normal Lower (legs, knees, ankles, toes): None, normal, Trunk Movements Neck, shoulders, hips: None, normal, Overall Severity Severity of abnormal movements (highest score from questions above): None, normal Incapacitation due to abnormal movements: None, normal Patient's awareness of abnormal movements (rate only patient's report): No Awareness, Dental Status Current problems with teeth and/or dentures?: No Does patient usually wear dentures?: No  CIWA:    COWS:     Musculoskeletal: Strength & Muscle Tone: within normal limits Gait & Station: normal Patient leans: N/A  Psychiatric Specialty Exam: Physical Exam  Nursing note and vitals reviewed. Constitutional: She appears well-developed and well-nourished.  HENT:  Head: Normocephalic and atraumatic.  Respiratory: Effort normal.  Neurological: She is alert.    Review of Systems  Blood pressure 108/70, pulse 84, temperature 98.4 F (36.9 C), temperature source Oral, resp. rate 20, height 5' 9.25" (1.759 m), weight 52.6 kg, SpO2 97 %.Body mass index is 17.01 kg/m.  General Appearance: Casual  Eye Contact:  Fair  Speech:  Normal Rate  Volume:  Normal  Mood:  Dysphoric and Irritable  Affect:  Congruent  Thought Process:  Goal Directed and  Descriptions of Associations: Circumstantial  Orientation:  Negative  Thought Content:  Delusions and Paranoid Ideation  Suicidal Thoughts:  No  Homicidal Thoughts:  No  Memory:  Immediate;   Poor Recent;   Poor Remote;   Poor  Judgement:  Impaired  Insight:  Lacking  Psychomotor Activity:  Normal  Concentration:  Concentration: Fair and Attention Span: Fair  Recall:  [employer] of Knowledge:  Fair  Language:  Good  Akathisia:  Negative  Handed:  Right  AIMS (if indicated):     Assets:  Desire for Improvement Resilience  ADL's:  Intact  Cognition:  WNL  Sleep:  Number of Hours: 3     Treatment Plan Summary: Daily contact with patient to assess and evaluate symptoms and progress in treatment, Medication management and Plan : Patient is seen and examined.  Patient is a 19 year old female with the above-stated past psychiatric history who is seen in follow-up.   Diagnosis: #1 schizophrenia  Patient is seen in follow-up.  She has apparently been taking oral medications, but on examination is essentially unchanged.  She has no insight to her illness, and I suspect that she will become noncompliant with her oral medicine shortly after discharge.  She is still not sleeping well.  She is already on Risperdal 4 mg twice daily, so very limited in terms of increasing that dosage of antipsychotic.  She does have IM Haldol written in case of noncompliance.  I am going to give her the long-acting Risperdal injection today.  Hopefully that will provide some additional benefit.  Then if she stops her oral medicines she will have protection.  I am going to stop the temazepam, and put her on trazodone 100 mg p.o. nightly, and hopefully that will help her rest more comfortably.  1.  Continue Cogentin 1 mg p.o. twice daily for side effects of medication. 2.  Continue fluoxetine 20 mg p.o. daily for anxiety and depression. 3.  Continue Haldol 10 mg IM twice daily if she refuses Risperdal. 4.  Continue  hydroxyzine 25 mg p.o. 3 times  daily as needed anxiety. 5.  Continue Risperdal M tabs 4 mg p.o. twice daily for psychosis. 6.  Give long-acting Risperdal injection 37.5 mg IM q. 14 days first dose today for psychosis. 7.  Stop temazepam. 8.  Start trazodone 100 mg p.o. nightly as needed insomnia. 9.  Disposition planning-in progress.  Antonieta Pert, MD 06/04/2019, 11:45 AM

## 2019-06-04 NOTE — Progress Notes (Signed)
Pt refused labs.  

## 2019-06-04 NOTE — [facility] Group Notes (Signed)
  [facility] LCSW Group Therapy Note  Date/Time:  06/04/2019 11:15AM-12:00PM  Type of Therapy and Topic:  Group Therapy:  Feelings About Hospitalization  Participation Level:  Did Not Attend   Description of Group This process group involved patients discussing their feelings related to being hospitalized, as well as the benefits they see to being in the hospital.  These feelings and benefits were itemized.  The group then brainstormed specific ways in which they could seek those same benefits when they discharge and return home.  Therapeutic Goals Patient will identify and describe positive and negative feelings related to hospitalization Patient will verbalize benefits of hospitalization to themselves personally Patients will brainstorm together ways they can obtain similar benefits in the outpatient setting, identify barriers to wellness and possible solutions  Summary of Patient Progress:  The patient was invited to group, did not attend.  Therapeutic Modalities Cognitive Behavioral Therapy Motivational Interviewing    Ambrose Mantle, LCSW 06/04/2019, 9:28 AM

## 2019-06-04 NOTE — Progress Notes (Signed)
Pt refused evening PO meds. Haldo IM administered per force med order with a show of support.

## 2019-06-04 NOTE — Progress Notes (Addendum)
Risperdal Consta  injection administered this afternoon without force. Education and support provided. Pt was calm and cooperative.

## 2019-06-04 NOTE — Progress Notes (Signed)
   06/04/19 1000  Psych Admission Type (Psych Patients Only)  Admission Status Involuntary  Psychosocial Assessment  Patient Complaints None  Eye Contact Brief  Facial Expression Anxious  Affect Appropriate to circumstance  Speech Logical/coherent  Interaction Cautious;Forwards little;Minimal  Motor Activity Fidgety  Appearance/Hygiene In scrubs  Behavior Characteristics Appropriate to situation  Mood Suspicious;Labile  Aggressive Behavior  Effect No apparent injury  Thought Process  Coherency Circumstantial  Content WDL  Delusions None reported or observed  Perception WDL  Hallucination None reported or observed  Judgment Poor  Confusion WDL  Danger to Self  Current suicidal ideation? Denies  Danger to Others  Danger to Others None reported or observed  Danger to Others Abnormal  Harmful Behavior to others No threats or harm toward other people  Destructive Behavior No threats or harm toward property

## 2019-06-05 NOTE — Progress Notes (Signed)
Pt refused PO medications this morning. She stated, "you are not a doctor. You could be holding a gun behind your back." A show of support was provided to administer Haldol IM per force medication order. Medication administered to the pt w/o force or a manual hold. During medication administration the pt stated "she injecting me with water."

## 2019-06-05 NOTE — Progress Notes (Signed)
Adult Psychoeducational Group Note  Date:  06/05/2019 Time:  9:20 PM  Group Topic/Focus:  Wrap-Up Group:   The focus of this group is to help patients review their daily goal of treatment and discuss progress on daily workbooks.  Participation Level:  Did Not Attend  Participation Quality:  Did Not Attend  Affect:  Did Not Attend  Cognitive:  Did Not Attend  Insight: None  Engagement in Group:  Did Not Attend  Modes of Intervention:  Did Not Attend  Additional Comments:  Pt did not attend evening wrap up group tonight.  Felipa Furnace 06/05/2019, 9:20 PM

## 2019-06-05 NOTE — Progress Notes (Signed)
   06/04/19 2230  Psych Admission Type (Psych Patients Only)  Admission Status Involuntary  Psychosocial Assessment  Patient Complaints None (pt in bed resting, selective mutism)  Eye Contact Other (Comment)  Facial Expression Other (Comment)  Affect UTA  Speech UTA  Interaction Other (Comment)  Motor Activity Other (Comment)  Appearance/Hygiene Disheveled  Behavior Characteristics Unwilling to participate  Mood Suspicious  Thought Process  Coherency Unable to assess  Content UTA  Delusions UTA  Perception UTA  Hallucination UTA  Judgment UTA  Danger to Others  Danger to Others None reported or observed

## 2019-06-05 NOTE — [facility] Group Notes (Signed)
[facility] LCSW Group Therapy Note  Date/Time:  06/05/2019  11:00AM-12:00PM  Type of Therapy and Topic:  Group Therapy:  Music and Mood  Participation Level:  Did Not Attend   Description of Group: In this process group, members listened to a variety of genres of music and identified that different types of music evoke different responses.  Patients were encouraged to identify music that was soothing for them and music that was energizing for them.  Patients discussed how this knowledge can help with wellness and recovery in various ways including managing depression and anxiety as well as encouraging healthy sleep habits.    Therapeutic Goals: Patients will explore the impact of different varieties of music on mood Patients will verbalize the thoughts they have when listening to different types of music Patients will identify music that is soothing to them as well as music that is energizing to them Patients will discuss how to use this knowledge to assist in maintaining wellness and recovery Patients will explore the use of music as a coping skill  Summary of Patient Progress:  Patient was invited to group, did not attend.  Therapeutic Modalities: Solution Focused Brief Therapy Activity   Angie Patella Grossman-Orr, LCSW    

## 2019-06-05 NOTE — Progress Notes (Signed)
   06/01/19 0559  Psych Admission Type (Psych Patients Only)  Admission Status Involuntary  Psychosocial Assessment  Patient Complaints None  Eye Contact Brief  Facial Expression Other (Comment) (pt asleep)  Affect Other (Comment) (pt asleep)  Speech Other (Comment)  Interaction Other (Comment) (asleep)  Motor Activity Other (Comment) (asleep)  Appearance/Hygiene In scrubs  Mood Other (Comment)  Thought Process  Coherency Unable to assess  Content UTA  Delusions UTA  Perception UTA  Hallucination UTA  Judgment UTA  Confusion UTA  Danger to Self  Current suicidal ideation?  (asleep)  Danger to Others  Danger to Others None reported or observed

## 2019-06-05 NOTE — Progress Notes (Signed)
Pt refused evening medications. Verbal support, education, and encouragement provided. Pt was threatening at this time and threatened to shoot staff and was making shooting gestures. Pt was placed in a manual hold (two staff hold) for the purpose of medication administration per force medication order. Pt safety maintained. No injuries noted.

## 2019-06-05 NOTE — Progress Notes (Signed)
Patient has been asleep since shift change. Patient received IM medications on day shift due to her refusing meds. Safety maintained with 15 min checks.

## 2019-06-05 NOTE — Progress Notes (Signed)
Adult Psychoeducational Group Note  Date:  06/05/2019 Time:  1:34 AM  Group Topic/Focus:  Wrap-Up Group:   The focus of this group is to help patients review their daily goal of treatment and discuss progress on daily workbooks.  Participation Level:  Did Not Attend  Participation Quality:  Did Not Attend  Affect:  Did Not Attend  Cognitive:  Did Not Attend  Insight: None  Engagement in Group:  Did Not Attend  Modes of Intervention:  Did Not Attend  Additional Comments:  Pt did not attend evening wrap up group tonight.  Felipa Furnace 06/05/2019, 1:34 AM

## 2019-06-05 NOTE — Progress Notes (Signed)
   06/05/19 2318  COVID-19 Daily Checkoff  Have you had a fever (temp > 37.80C/100F)  in the past 24 hours?  No  If you have had runny nose, nasal congestion, sneezing in the past 24 hours, has it worsened? No  COVID-19 EXPOSURE  Have you traveled outside the state in the past 14 days? No  Have you been in contact with someone with a confirmed diagnosis of COVID-19 or PUI in the past 14 days without wearing appropriate PPE? No  Have you been living in the same home as a person with confirmed diagnosis of COVID-19 or a PUI (household contact)? No  Have you been diagnosed with COVID-19? No

## 2019-06-05 NOTE — Progress Notes (Signed)
   06/05/19 0800  Psych Admission Type (Psych Patients Only)  Admission Status Involuntary  Psychosocial Assessment  Patient Complaints None  Eye Contact Brief  Facial Expression Flat  Affect Inconsistent with thought content;Preoccupied;Labile  Speech Tangential  Interaction Childlike;Forwards little  Motor Activity Fidgety  Appearance/Hygiene Disheveled;In scrubs  Behavior Characteristics Unwilling to participate;Irritable;Agitated  Mood Suspicious;Labile  Thought Process  Coherency Loose associations;Disorganized;Circumstantial  Content Preoccupation;Paranoia  Delusions None reported or observed  Perception Derealization  Hallucination None reported or observed  Judgment Impaired  Confusion Mild  Danger to Self  Current suicidal ideation? Denies  Danger to Others  Danger to Others None reported or observed  Danger to Others Abnormal  Harmful Behavior to others No threats or harm toward other people  Destructive Behavior No threats or harm toward property

## 2019-06-05 NOTE — Progress Notes (Signed)
   06/04/19 2230  COVID-19 Daily Checkoff  Have you had a fever (temp > 37.80C/100F)  in the past 24 hours?  No  If you have had runny nose, nasal congestion, sneezing in the past 24 hours, has it worsened? No  COVID-19 EXPOSURE  Have you traveled outside the state in the past 14 days? No  Have you been in contact with someone with a confirmed diagnosis of COVID-19 or PUI in the past 14 days without wearing appropriate PPE? No  Have you been living in the same home as a person with confirmed diagnosis of COVID-19 or a PUI (household contact)? No  Have you been diagnosed with COVID-19? No

## 2019-06-05 NOTE — Progress Notes (Signed)
[facility] MD Progress Note  06/05/2019 11:00 AM Angie Lopez  MRN:  [id number] Subjective:  Patient is a 19 year old female with a past psychiatric history significant for probable schizophrenia who was admitted on 06/01/2019 under involuntary commitment secondary to delusional beliefs, physically combatively fighting with [facility] police and security as well.  She was noted to be significantly paranoid upon evaluation at the hospital.  Objective: Patient is seen and examined.  Patient is a 19 year old female with the above-stated past psychiatric history who is seen in follow-up.  She is essentially unchanged from yesterday.  She remains irritable.  She had the long-acting Risperdal injection given yesterday.  She refused her oral medicines this morning, and received the intermuscular Haldol.  She stated she still does not need to be here, and wants to be released.  Last night after receiving the injection she did sleep better.  She got 6.5 hours of sleep as reported by nursing notes.  Her blood pressure is 104/66.  Pulse was 71.  She is afebrile.  She denies all symptoms.  Principal Problem: <principal problem not specified> Diagnosis: Active Problems:   Delusional disorder (HCC)  Total Time spent with patient: 15 minutes  Past Psychiatric History: See admission H&P  Past Medical History:  Past Medical History:  Diagnosis Date  . Acne 08/2011   epiduo 2012  . ADHD (attention deficit hyperactivity disorder) 2009   HTN on stimulants, off meds 03/2012  . Allergic conjunctivitis 2012  . Allergic rhinitis 08/2010  . Learning disorder 08/2010   borderline IQ, had IEP in 2012  . Obesity 08/13/2010  . Prediabetes    HBA1C 6.0 03/2010, never took metromin in 2012-2014  . SCFE (slipped capital femoral epiphysis) 2011    Past Surgical History:  Procedure Laterality Date  . HIP PINNING  12/2009   Family History:  Family History  Problem Relation Age of Onset  . Obesity Sister   . Obesity Brother    . Hypertension Maternal Grandmother   . Diabetes Maternal Grandmother   . Obesity Maternal Grandmother   . Hypertension Maternal Grandfather   . Obesity Mother   . Diabetes Maternal Aunt   . Obesity Maternal Aunt   . Diabetes Cousin   . Thyroid disease Neg Hx    Family Psychiatric  History: See admission H&P Social History:  Social History   Substance and Sexual Activity  Alcohol Use Never  . Alcohol/week: 0.0 standard drinks     Social History   Substance and Sexual Activity  Drug Use Yes  . Types: Marijuana, Cocaine   Comment: pt denies current drug use    Social History   Socioeconomic History  . Marital status: Single    Spouse name: Not on file  . Number of children: Not on file  . Years of education: Not on file  . Highest education level: Not on file  Occupational History  . Not on file  Tobacco Use  . Smoking status: Former Smoker    Types: Cigarettes  . Smokeless tobacco: Never Used  Substance and Sexual Activity  . Alcohol use: Never    Alcohol/week: 0.0 standard drinks  . Drug use: Yes    Types: Marijuana, Cocaine    Comment: pt denies current drug use  . Sexual activity: Not Currently    Birth control/protection: Implant    Comment: "taken out about a year ago. Don't need it now"  Other Topics Concern  . Not on file  Social History Narrative   Lives at  home with mom and sister, attends [employer] is in 7th grade. [name].    Social Determinants of Health   Financial Resource Strain:   . Difficulty of Paying Living Expenses:   Food Insecurity:   . Worried About Programme researcher, broadcasting/film/video in the Last Year:   . Barista in the Last Year:   Transportation Needs:   . Freight forwarder (Medical):   Marland Kitchen Lack of Transportation (Non-Medical):   Physical Activity:   . Days of Exercise per Week:   . Minutes of Exercise per Session:   Stress:   . Feeling of Stress :   Social Connections:   . Frequency of Communication with Friends and  Family:   . Frequency of Social Gatherings with Friends and Family:   . Attends Religious Services:   . Active Member of Clubs or Organizations:   . Attends Banker Meetings:   Marland Kitchen Marital Status:    Additional Social History:                         Sleep: Fair  Appetite:  Fair  Current Medications: Current Facility-Administered Medications  Medication Dose Route Frequency Provider Last Rate Last Admin  . acetaminophen (TYLENOL) tablet 650 mg  650 mg Oral Q4H PRN Antonieta Pert, MD      . benztropine (COGENTIN) tablet 1 mg  1 mg Oral BID Malvin Johns, MD   1 mg at 06/04/19 0803  . feeding supplement (ENSURE ENLIVE) (ENSURE ENLIVE) liquid 237 mL  237 mL Oral BID BM Cobos, Rockey Situ, MD   237 mL at 06/04/19 1257  . FLUoxetine (PROZAC) capsule 20 mg  20 mg Oral Daily Antonieta Pert, MD   20 mg at 06/04/19 0802  . haloperidol lactate (HALDOL) injection 10 mg  10 mg Intramuscular BID PRN Malvin Johns, MD   10 mg at 06/05/19 0804  . hydrOXYzine (ATARAX/VISTARIL) tablet 25 mg  25 mg Oral TID PRN Antonieta Pert, MD      . risperiDONE (RISPERDAL M-TABS) disintegrating tablet 4 mg  4 mg Oral BID Malvin Johns, MD   4 mg at 06/04/19 0803  . risperiDONE microspheres (RISPERDAL CONSTA) injection 37.5 mg  37.5 mg Intramuscular Q14 Days Antonieta Pert, MD   37.5 mg at 06/04/19 1448  . traZODone (DESYREL) tablet 100 mg  100 mg Oral QHS PRN Antonieta Pert, MD        Lab Results: No results found for this or any previous visit (from the past 48 hour(s)).  Blood Alcohol level:  Lab Results  Component Value Date   ETH <10 05/27/2019   ETH <10 11/19/2018    Metabolic Disorder Labs: Lab Results  Component Value Date   HGBA1C 5.0 11/19/2018   MPG 97 11/19/2018   Lab Results  Component Value Date   PROLACTIN 44.1 (H) 11/19/2018   Lab Results  Component Value Date   CHOL 170 11/19/2018   TRIG 50 11/19/2018   HDL 49 11/19/2018   CHOLHDL 3.5  11/19/2018   VLDL 10 11/19/2018   LDLCALC 111 (H) 11/19/2018    Physical Findings: AIMS: Facial and Oral Movements Muscles of Facial Expression: None, normal Lips and Perioral Area: None, normal Jaw: None, normal Tongue: None, normal,Extremity Movements Upper (arms, wrists, hands, fingers): None, normal Lower (legs, knees, ankles, toes): None, normal, Trunk Movements Neck, shoulders, hips: None, normal, Overall Severity Severity of abnormal movements (highest score  from questions above): None, normal Incapacitation due to abnormal movements: None, normal Patient's awareness of abnormal movements (rate only patient's report): No Awareness, Dental Status Current problems with teeth and/or dentures?: No Does patient usually wear dentures?: No  CIWA:    COWS:     Musculoskeletal: Strength & Muscle Tone: within normal limits Gait & Station: normal Patient leans: N/A  Psychiatric Specialty Exam: Physical Exam  Nursing note and vitals reviewed. Constitutional: She is oriented to person, place, and time. She appears well-developed and well-nourished.  HENT:  Head: Normocephalic and atraumatic.  Respiratory: Effort normal.  Neurological: She is alert and oriented to person, place, and time.    Review of Systems  Blood pressure 104/66, pulse 71, temperature 98 F (36.7 C), temperature source Oral, resp. rate 20, height 5' 9.25" (1.759 m), weight 52.6 kg, SpO2 97 %.Body mass index is 17.01 kg/m.  General Appearance: Casual  Eye Contact:  Good  Speech:  Normal Rate  Volume:  Increased  Mood:  Dysphoric and Irritable  Affect:  Congruent  Thought Process:  Coherent and Descriptions of Associations: Circumstantial  Orientation:  Full (Time, Place, and Person)  Thought Content:  Delusions and Paranoid Ideation  Suicidal Thoughts:  No  Homicidal Thoughts:  No  Memory:  Immediate;   Poor Recent;   Poor Remote;   Poor  Judgement:  Impaired  Insight:  Lacking  Psychomotor  Activity:  Normal  Concentration:  Concentration: Fair and Attention Span: Fair  Recall:  [employer] of Knowledge:  Fair  Language:  Fair  Akathisia:  Negative  Handed:  Right  AIMS (if indicated):     Assets:  Desire for Improvement Resilience  ADL's:  Intact  Cognition:  WNL  Sleep:  Number of Hours: 6.5     Treatment Plan Summary: Daily contact with patient to assess and evaluate symptoms and progress in treatment, Medication management and Plan : Patient is seen and examined.  Patient is a 19 year old female with the above-stated past psychiatric history who is seen in follow-up.   Diagnosis: #1 schizophrenia  Patient is seen in follow-up.  She is essentially unchanged.  She did receive the long-acting Risperdal injection yesterday.  She has refused oral medications, and has had to have intramuscular injections of the Haldol this morning.  Hopefully she will begin to have some improvement with the long-acting Risperdal in her system.  No change in her medications today.  1.  Continue Cogentin 1 mg p.o. twice daily for side effects of medication. 2.  Continue fluoxetine 20 mg p.o. daily for anxiety and depression. 3.  Continue Haldol 10 mg IM twice daily if she refuses Risperdal. 4.  Continue hydroxyzine 25 mg p.o. 3 times daily as needed anxiety. 5.  Continue Risperdal M tabs 4 mg p.o. twice daily for psychosis. 6.  Received the long-acting Risperdal injection 37.5 mg IM q. 14 days first dose on 3/27 for psychosis. 7.    Continue trazodone 100 mg p.o. nightly as needed insomnia. 8.  Disposition planning-in progress.  Sharma Covert, MD 06/05/2019, 11:00 AM

## 2019-06-06 NOTE — Progress Notes (Signed)
Recreation Therapy Notes  Date: 3.29.21 Time: 1000 Location: 500 Hall Dayroom  Group Topic: Coping Skills  Goal Area(s) Addresses:  Patient will identify positive coping skills. Patient will identify benefit of using coping skills.  Intervention: Worksheet, pencils, white board, marker  Activity: Mind Map.  LRT and patients filled out the first 8 boxes (anger, sadness, depression, anxiety, arguments, self esteem, lack of coping skills and guilt) of the mind map together.  Patients were then given time to come up with at least 3 coping skills for each area identified.  The group would come back together and LRT would write the coping skills on the board.  Education:Coping Skills, Discharge Planning.   Education Outcome: Acknowledges understanding/In group clarification offered/Needs additional education.   Clinical Observations/Feedback: Pt did not attend group session.    Caroll Rancher, LRT/CTRS         Caroll Rancher A 06/06/2019 11:54 AM

## 2019-06-06 NOTE — [facility] Group Notes (Signed)
LCSW Group Therapy Notes 06/06/2019 3:01 PM  Type of Therapy and Topic: Group Therapy: Overcoming Obstacles  Participation Level: Did Not Attend  Description of Group:  In this group patients will be encouraged to explore what they see as obstacles to their own wellness and recovery. They will be guided to discuss their thoughts, feelings, and behaviors related to these obstacles. The group will process together ways to cope with barriers, with attention given to specific choices patients can make. Each patient will be challenged to identify changes they are motivated to make in order to overcome their obstacles. This group will be process-oriented, with patients participating in exploration of their own experiences as well as giving and receiving support and challenge from other group members.  Therapeutic Goals: 1. Patient will identify personal and current obstacles as they relate to admission. 2. Patient will identify barriers that currently interfere with their wellness or overcoming obstacles.  3. Patient will identify feelings, thought process and behaviors related to these barriers. 4. Patient will identify two changes they are willing to make to overcome these obstacles:   Summary of Patient Progress Sleeping, did not attend.    Therapeutic Modalities:  Cognitive Behavioral Therapy Solution Focused Therapy Motivational Interviewing Relapse Prevention Therapy  Betzabe Bevans, MSW, LCSWA 06/06/2019 3:01 PM   

## 2019-06-06 NOTE — Tx Team (Signed)
Interdisciplinary Treatment and Diagnostic Plan Update  06/06/2019 Time of Session: 9:00am Angie Lopez MRN: [id number]  Principal Diagnosis: <principal problem not specified>  Secondary Diagnoses: Active Problems:   Delusional disorder (HCC)   Current Medications:  Current Facility-Administered Medications  Medication Dose Route Frequency Provider Last Rate Last Admin  . acetaminophen (TYLENOL) tablet 650 mg  650 mg Oral Q4H PRN Antonieta Pert, MD      . benztropine (COGENTIN) tablet 1 mg  1 mg Oral BID Malvin Johns, MD   1 mg at 06/04/19 0803  . feeding supplement (ENSURE ENLIVE) (ENSURE ENLIVE) liquid 237 mL  237 mL Oral BID BM Cobos, Rockey Situ, MD   237 mL at 06/04/19 1257  . FLUoxetine (PROZAC) capsule 20 mg  20 mg Oral Daily Antonieta Pert, MD   20 mg at 06/04/19 0802  . haloperidol lactate (HALDOL) injection 10 mg  10 mg Intramuscular BID PRN Malvin Johns, MD   10 mg at 06/05/19 1711  . hydrOXYzine (ATARAX/VISTARIL) tablet 25 mg  25 mg Oral TID PRN Antonieta Pert, MD      . risperiDONE (RISPERDAL M-TABS) disintegrating tablet 4 mg  4 mg Oral BID Malvin Johns, MD   4 mg at 06/04/19 0803  . risperiDONE microspheres (RISPERDAL CONSTA) injection 37.5 mg  37.5 mg Intramuscular Q14 Days Antonieta Pert, MD   37.5 mg at 06/04/19 1448  . traZODone (DESYREL) tablet 100 mg  100 mg Oral QHS PRN Antonieta Pert, MD       PTA Medications: Medications Prior to Admission  Medication Sig Dispense Refill Last Dose  . FLUoxetine (PROZAC) 20 MG capsule Take 1 capsule (20 mg total) by mouth daily. (Patient not taking: Reported on 11/17/2018) 30 capsule 3   . Lumateperone Tosylate (CAPLYTA) 42 MG CAPS Take 42 mg by mouth daily. (Patient not taking: Reported on 01/20/2019) 30 capsule 11   . omega-3 acid ethyl esters (LOVAZA) 1 g capsule Take 1 capsule (1 g total) by mouth 2 (two) times daily. (Patient not taking: Reported on 01/20/2019) 60 capsule 5   . Prenatal Vit-Fe Fumarate-FA  (PRENATAL MULTIVITAMIN) TABS tablet Take 1 tablet by mouth daily at 12 noon. (Patient not taking: Reported on 01/20/2019) 90 tablet 1   . temazepam (RESTORIL) 30 MG capsule Take 1 capsule (30 mg total) by mouth at bedtime. (Patient not taking: Reported on 05/27/2019) 30 capsule 0     Patient Stressors: Financial difficulties Marital or family conflict Medication change or noncompliance Occupational concerns Substance abuse  Patient Strengths: Physical Health Supportive family/friends  Treatment Modalities: Medication Management, Group therapy, Case management,  1 to 1 session with clinician, Psychoeducation, Recreational therapy.   Physician Treatment Plan for Primary Diagnosis: <principal problem not specified> Long Term Goal(s): Improvement in symptoms so as ready for discharge Improvement in symptoms so as ready for discharge   Short Term Goals: Ability to identify changes in lifestyle to reduce recurrence of condition will improve Ability to verbalize feelings will improve Ability to disclose and discuss suicidal ideas Ability to identify changes in lifestyle to reduce recurrence of condition will improve Ability to verbalize feelings will improve  Medication Management: Evaluate patient's response, side effects, and tolerance of medication regimen.  Therapeutic Interventions: 1 to 1 sessions, Unit Group sessions and Medication administration.  Evaluation of Outcomes: Progressing  Physician Treatment Plan for Secondary Diagnosis: Active Problems:   Delusional disorder (HCC)  Long Term Goal(s): Improvement in symptoms so as ready for discharge Improvement in symptoms  so as ready for discharge   Short Term Goals: Ability to identify changes in lifestyle to reduce recurrence of condition will improve Ability to verbalize feelings will improve Ability to disclose and discuss suicidal ideas Ability to identify changes in lifestyle to reduce recurrence of condition will  improve Ability to verbalize feelings will improve     Medication Management: Evaluate patient's response, side effects, and tolerance of medication regimen.  Therapeutic Interventions: 1 to 1 sessions, Unit Group sessions and Medication administration.  Evaluation of Outcomes: Progressing   RN Treatment Plan for Primary Diagnosis: <principal problem not specified> Long Term Goal(s): Knowledge of disease and therapeutic regimen to maintain health will improve  Short Term Goals: Ability to verbalize frustration and anger appropriately will improve, Ability to verbalize feelings will improve, Ability to identify and develop effective coping behaviors will improve and Compliance with prescribed medications will improve  Medication Management: RN will administer medications as ordered by provider, will assess and evaluate patient's response and provide education to patient for prescribed medication. RN will report any adverse and/or side effects to prescribing provider.  Therapeutic Interventions: 1 on 1 counseling sessions, Psychoeducation, Medication administration, Evaluate responses to treatment, Monitor vital signs and CBGs as ordered, Perform/monitor CIWA, COWS, AIMS and Fall Risk screenings as ordered, Perform wound care treatments as ordered.  Evaluation of Outcomes: Progressing   LCSW Treatment Plan for Primary Diagnosis: <principal problem not specified> Long Term Goal(s): Safe transition to appropriate next level of care at discharge, Engage patient in therapeutic group addressing interpersonal concerns.  Short Term Goals: Engage patient in aftercare planning with referrals and resources, Identify triggers associated with mental health/substance abuse issues and Increase skills for wellness and recovery  Therapeutic Interventions: Assess for all discharge needs, 1 to 1 time with Social worker, Explore available resources and support systems, Assess for adequacy in community support  network, Educate family and significant other(s) on suicide prevention, Complete Psychosocial Assessment, Interpersonal group therapy.  Evaluation of Outcomes: Progressing  Progress in Treatment: Attending groups: No.  Participating in groups: No. Taking medication as prescribed: Yes. Toleration medication: Yes. Family/Significant other contact made: Yes, individual(s) contacted:  mother, Magda Paganini Patient understands diagnosis: No. Discussing patient identified problems/goals with staff: Yes. Medical problems stabilized or resolved: Yes. Denies suicidal/homicidal ideation: Yes. Issues/concerns per patient self-inventory: No.  New problem(s) identified: No, Describe:  CSW assessing   New Short Term/Long Term Goal(s):  medication management for mood stabilization; elimination of SI thoughts; development of comprehensive mental wellness/sobriety plan.  Patient Goals:  "I'm ready to go home."  Discharge Plan or Barriers: Plans to return home and will follow up with [facility] for outpatient therapy and medication management.  Reason for Continuation of Hospitalization: Medication stabilization  Estimated Length of Stay: 1-3 days  Attendees: Patient: Angie Lopez 06/06/2019 10:39 AM  Physician: Dr.Farah 06/06/2019 10:39 AM  Nursing:  06/06/2019 10:39 AM  RN Care Manager: 06/06/2019 10:39 AM  Social Worker: Stephanie Acre, [place] 06/06/2019 10:39 AM  Recreational Therapist:  06/06/2019 10:39 AM  Other:  06/06/2019 10:39 AM  Other:  06/06/2019 10:39 AM  Other: 06/06/2019 10:39 AM    Scribe for Treatment Team: [name], [place] 06/06/2019 10:39 AM

## 2019-06-06 NOTE — Progress Notes (Signed)
Pt refuses PO medications, given IM back up per orders, pt is easily irritable and argumentative, delusional, selective with whom she'll allow give her IM injection. Pt is verbally abusive and threatening to "rob your house." Pt states, "She's not a nurse" directing that statement towards this Clinical research associate. Pt is delusional, makes bizzare statements not based in reality, refuses feedback or reality orientation, denies suicidal and homicidal ideation, when asked about hallucinations states, "That's not your business. Who are you. You dumb b*tch." Pt isolates in her room comes out for meals and to request a towel. Will continue to monitor pt per Q15 minute face checks and monitor for safety and progress.   Problem: Activity: Goal: Imbalance in normal sleep/wake cycle will improve 06/06/2019 1423 by Ginger Carne, RN Outcome: Progressing 06/06/2019 1423 by Ginger Carne, RN Outcome: Progressing

## 2019-06-06 NOTE — Progress Notes (Signed)
[facility] MD Progress Note  06/06/2019 11:32 AM Angie Lopez  MRN:  [id number] Subjective:    Remains irritable unpleasant and argumentative stating she wants to leave immediately.  When asked if she has a place to go and live and she states "that is none of your business" remains argumentative however she did receive long-acting injectable.  We had a plan to probably discharge tomorrow to give long-acting injection some time to absorb.  No change in precautions continue to monitor on 15-minute checks- Continues to lack insight Principal Problem: Resistant to treatment of psychosis Diagnosis: Active Problems:   Delusional disorder (HCC)  Total Time spent with patient: 20 minutes  Past Psychiatric History: Eval  Past Medical History:  Past Medical History:  Diagnosis Date  . Acne 08/2011   epiduo 2012  . ADHD (attention deficit hyperactivity disorder) 2009   HTN on stimulants, off meds 03/2012  . Allergic conjunctivitis 2012  . Allergic rhinitis 08/2010  . Learning disorder 08/2010   borderline IQ, had IEP in 2012  . Obesity 08/13/2010  . Prediabetes    HBA1C 6.0 03/2010, never took metromin in 2012-2014  . SCFE (slipped capital femoral epiphysis) 2011    Past Surgical History:  Procedure Laterality Date  . HIP PINNING  12/2009   Family History:  Family History  Problem Relation Age of Onset  . Obesity Sister   . Obesity Brother   . Hypertension Maternal Grandmother   . Diabetes Maternal Grandmother   . Obesity Maternal Grandmother   . Hypertension Maternal Grandfather   . Obesity Mother   . Diabetes Maternal Aunt   . Obesity Maternal Aunt   . Diabetes Cousin   . Thyroid disease Neg Hx    Family Psychiatric  History: See eval Social History:  Social History   Substance and Sexual Activity  Alcohol Use Never  . Alcohol/week: 0.0 standard drinks     Social History   Substance and Sexual Activity  Drug Use Yes  . Types: Marijuana, Cocaine   Comment: pt denies current  drug use    Social History   Socioeconomic History  . Marital status: Single    Spouse name: Not on file  . Number of children: Not on file  . Years of education: Not on file  . Highest education level: Not on file  Occupational History  . Not on file  Tobacco Use  . Smoking status: Former Smoker    Types: Cigarettes  . Smokeless tobacco: Never Used  Substance and Sexual Activity  . Alcohol use: Never    Alcohol/week: 0.0 standard drinks  . Drug use: Yes    Types: Marijuana, Cocaine    Comment: pt denies current drug use  . Sexual activity: Not Currently    Birth control/protection: Implant    Comment: "taken out about a year ago. Don't need it now"  Other Topics Concern  . Not on file  Social History Narrative   Lives at home with mom and sister, attends [employer] is in 7th grade. [name].    Social Determinants of Health   Financial Resource Strain:   . Difficulty of Paying Living Expenses:   Food Insecurity:   . Worried About Programme researcher, broadcasting/film/video in the Last Year:   . Barista in the Last Year:   Transportation Needs:   . Freight forwarder (Medical):   Marland Kitchen Lack of Transportation (Non-Medical):   Physical Activity:   . Days of Exercise per Week:   .  Minutes of Exercise per Session:   Stress:   . Feeling of Stress :   Social Connections:   . Frequency of Communication with Friends and Family:   . Frequency of Social Gatherings with Friends and Family:   . Attends Religious Services:   . Active Member of Clubs or Organizations:   . Attends Archivist Meetings:   Marland Kitchen Marital Status:    Additional Social History:                         Sleep: Fair  Appetite:  Fair  Current Medications: Current Facility-Administered Medications  Medication Dose Route Frequency Provider Last Rate Last Admin  . acetaminophen (TYLENOL) tablet 650 mg  650 mg Oral Q4H PRN Sharma Covert, MD      . benztropine (COGENTIN) tablet 1 mg  1 mg  Oral BID Johnn Hai, MD   1 mg at 06/04/19 0803  . feeding supplement (ENSURE ENLIVE) (ENSURE ENLIVE) liquid 237 mL  237 mL Oral BID BM Cobos, Myer Peer, MD   237 mL at 06/04/19 1257  . FLUoxetine (PROZAC) capsule 20 mg  20 mg Oral Daily Sharma Covert, MD   20 mg at 06/04/19 0802  . haloperidol lactate (HALDOL) injection 10 mg  10 mg Intramuscular BID PRN Johnn Hai, MD   10 mg at 06/05/19 1711  . hydrOXYzine (ATARAX/VISTARIL) tablet 25 mg  25 mg Oral TID PRN Sharma Covert, MD      . risperiDONE (RISPERDAL M-TABS) disintegrating tablet 4 mg  4 mg Oral BID Johnn Hai, MD   4 mg at 06/04/19 0803  . risperiDONE microspheres (RISPERDAL CONSTA) injection 37.5 mg  37.5 mg Intramuscular Q14 Days Sharma Covert, MD   37.5 mg at 06/04/19 1448  . traZODone (DESYREL) tablet 100 mg  100 mg Oral QHS PRN Sharma Covert, MD        Lab Results: No results found for this or any previous visit (from the past 48 hour(s)).  Blood Alcohol level:  Lab Results  Component Value Date   ETH <10 05/27/2019   ETH <10 77/82/4235    Metabolic Disorder Labs: Lab Results  Component Value Date   HGBA1C 5.0 11/19/2018   MPG 97 11/19/2018   Lab Results  Component Value Date   PROLACTIN 44.1 (H) 11/19/2018   Lab Results  Component Value Date   CHOL 170 11/19/2018   TRIG 50 11/19/2018   HDL 49 11/19/2018   CHOLHDL 3.5 11/19/2018   VLDL 10 11/19/2018   LDLCALC 111 (H) 11/19/2018    Physical Findings: AIMS: Facial and Oral Movements Muscles of Facial Expression: None, normal Lips and Perioral Area: None, normal Jaw: None, normal Tongue: None, normal,Extremity Movements Upper (arms, wrists, hands, fingers): None, normal Lower (legs, knees, ankles, toes): None, normal, Trunk Movements Neck, shoulders, hips: None, normal, Overall Severity Severity of abnormal movements (highest score from questions above): None, normal Incapacitation due to abnormal movements: None, normal Patient's  awareness of abnormal movements (rate only patient's report): No Awareness, Dental Status Current problems with teeth and/or dentures?: No Does patient usually wear dentures?: No  CIWA:    COWS:     Musculoskeletal: Strength & Muscle Tone: within normal limits Gait & Station: normal Patient leans: N/A  Psychiatric Specialty Exam: Physical Exam  Review of Systems  Blood pressure 120/72, pulse 81, temperature 98.3 F (36.8 C), temperature source Oral, resp. rate 20, height 5' 9.25" (1.759 m),  weight 52.6 kg, SpO2 97 %.Body mass index is 17.01 kg/m.  General Appearance: Casual  Eye Contact:  Poor  Speech:  Normal Rate  Volume:  Increased  Mood:  Angry and Irritable  Affect:  Constricted  Thought Process:  Irrelevant  Orientation:  Full (Time, Place, and Person)  Thought Content:  Illogical  Suicidal Thoughts:  No  Homicidal Thoughts:  No  Memory:  Remote;   Poor  Judgement:  Impaired  Insight:  Lacking  Psychomotor Activity:  Normal  Concentration:  Concentration: Poor and Attention Span: Poor  Recall:  Poor  Fund of Knowledge:  Poor  Language:  Poor  Akathisia:  Negative  Handed:  Right  AIMS (if indicated):     Assets:  Physical Health Resilience  ADL's:  Intact  Cognition:  WNL  Sleep:  Number of Hours: 5.75     Treatment Plan Summary: Daily contact with patient to assess and evaluate symptoms and progress in treatment and Medication management  Continue cognitive therapy reality therapy has long-acting injectable probable discharge tomorrow morning the injectable 20 over the more prednisone because she reports noncompliance will follow discharge  Falicia Lizotte, MD 06/06/2019, 11:32 AM

## 2019-06-06 NOTE — BHH Counselor (Signed)
CSW spoke with mother, [name] [phone] regarding treatment and discharge planning. Mother reports that patient has called her several times this morning insisting on being picked up. CSW confirmed for mother that patient is not being discharged today and is expected to discharge tomorrow.   CSW also shared with mother that patient received a long acting injectable.   CSW reviewed patient's outpatient follow up with mother.  Enid Cutter, MSW, LCSW-A Clinical Social Worker [facility]  [phone]

## 2019-06-06 NOTE — Progress Notes (Signed)
Adult Psychoeducational Group Note  Date:  06/06/2019 Time:  8:58 PM  Group Topic/Focus:  Wrap-Up Group:   The focus of this group is to help patients review their daily goal of treatment and discuss progress on daily workbooks.  Participation Level:  Did Not Attend  Participation Quality:  Did Not Attend  Affect:  Did Not Attend  Cognitive:  Did Not Attend  Insight: None  Engagement in Group:  Did Not Attend  Modes of Intervention:  Did Not Attend  Additional Comments:  Pt did not attend evening wrap up group tonight.  Felipa Furnace 06/06/2019, 8:58 PM

## 2019-06-07 MED ORDER — RISPERIDONE MICROSPHERES ER 37.5 MG IM SRER: 37.5000 mg | INTRAMUSCULAR | 11 refills | Status: DC

## 2019-06-07 NOTE — Progress Notes (Signed)
Pt discharged to lobby. Pt was stable and appreciative at that time. All papers and prescriptions were given and valuables returned. Verbal understanding expressed. Denies SI/HI and A/VH. Pt given opportunity to express concerns and ask questions.  

## 2019-06-07 NOTE — Progress Notes (Signed)
Recreation Therapy Notes  INPATIENT RECREATION TR PLAN  Patient Details Name: [name] MRN: [id number] DOB: 03-Aug-2000 Today's Date: 06/07/2019  Rec Therapy Plan Is patient appropriate for Therapeutic Recreation?: Yes Treatment times per week: about 3 days Estimated Length of Stay: 5-7 days TR Treatment/Interventions: Group participation (Comment)  Discharge Criteria Pt will be discharged from therapy if:: Discharged Treatment plan/goals/alternatives discussed and agreed upon by:: Patient/family  Discharge Summary Short term goals set: See patient care plan Short term goals met: Adequate for discharge Progress toward goals comments: Groups attended Which groups?: Wellness Reason goals not met: Pt attended one group session. Therapeutic equipment acquired: N/A Reason patient discharged from therapy: Discharge from hospital Pt/family agrees with progress & goals achieved: Yes Date patient discharged from therapy: 06/07/19    Victorino Sparrow, LRT/CTRS  Ria Comment, Aura Bibby A 06/07/2019, 10:46 AM

## 2019-06-07 NOTE — Progress Notes (Signed)
  [facility] Adult Case Management Discharge Plan :  Will you be returning to the same living situation after discharge:  Yes,  home. At discharge, do you have transportation home?: Yes,  mother will pick up at 10am. Do you have the ability to pay for your medications: Yes,  [place] [facility].  Release of information consent forms completed and in the chart.  Patient to Follow up at: Follow-up Information    [facility] Follow up on 06/08/2019.   Why: You are scheduled for an appointment on 06/08/19 at 9:30 am.  This will be a virtual tele-health appointment.   Contact information: [address] [place] [address] [phone]           Next level of care provider has access to [facility] Link:no  Safety Planning and Suicide Prevention discussed: Yes,  with mother, Verlon Au.  Has patient been referred to the Quitline?: N/A patient is not a smoker  Patient has been referred for addiction treatment: Yes  Darreld Mclean, LCSWA 06/07/2019, 8:59 AM

## 2019-06-07 NOTE — Plan of Care (Signed)
Pt isolatory to room through evening, appeared to be sleeping. RN entered room with support staff to give medication. PO med offered & pt refused but verbalized acceptance of IM alternative and allowed RN to administer without issue. Pt then became verbally aggressive toward RN & support staff, asking why staff was shaking and scared of her. Refused to answer questions regarding her needs at this time. Requested light be left on and door remain open which staff did. Monitoring for safety continues.    Problem: Education: Goal: Ability to state activities that reduce stress will improve Outcome: Not Progressing   Problem: Coping: Goal: Ability to identify and develop effective coping behavior will improve Outcome: Not Progressing   Problem: Self-Concept: Goal: Ability to identify factors that promote anxiety will improve Outcome: Not Progressing Goal: Level of anxiety will decrease Outcome: Not Progressing Goal: Ability to modify response to factors that promote anxiety will improve Outcome: Not Progressing   Problem: Education: Goal: Utilization of techniques to improve thought processes will improve Outcome: Not Progressing Goal: Knowledge of the prescribed therapeutic regimen will improve Outcome: Not Progressing   Problem: Activity: Goal: Interest or engagement in leisure activities will improve Outcome: Not Progressing Goal: Imbalance in normal sleep/wake cycle will improve Outcome: Not Progressing   Problem: Coping: Goal: Coping ability will improve Outcome: Not Progressing Goal: Will verbalize feelings Outcome: Not Progressing   Problem: Health Behavior/Discharge Planning: Goal: Ability to make decisions will improve Outcome: Not Progressing Goal: Compliance with therapeutic regimen will improve Outcome: Not Progressing   Problem: Role Relationship: Goal: Will demonstrate positive changes in social behaviors and relationships Outcome: Not Progressing   Problem:  Safety: Goal: Ability to disclose and discuss suicidal ideas will improve Outcome: Not Progressing Goal: Ability to identify and utilize support systems that promote safety will improve Outcome: Not Progressing   Problem: Self-Concept: Goal: Will verbalize positive feelings about self Outcome: Not Progressing Goal: Level of anxiety will decrease Outcome: Not Progressing   Problem: Education: Goal: Knowledge of [facility] Education information/materials will improve Outcome: Not Progressing Goal: Emotional status will improve Outcome: Not Progressing Goal: Mental status will improve Outcome: Not Progressing Goal: Verbalization of understanding the information provided will improve Outcome: Not Progressing   Problem: Activity: Goal: Interest or engagement in activities will improve Outcome: Not Progressing Goal: Sleeping patterns will improve Outcome: Not Progressing   Problem: Coping: Goal: Ability to verbalize frustrations and anger appropriately will improve Outcome: Not Progressing Goal: Ability to demonstrate self-control will improve Outcome: Not Progressing   Problem: Health Behavior/Discharge Planning: Goal: Identification of resources available to assist in meeting health care needs will improve Outcome: Not Progressing Goal: Compliance with treatment plan for underlying cause of condition will improve Outcome: Not Progressing   Problem: Physical Regulation: Goal: Ability to maintain clinical measurements within normal limits will improve Outcome: Not Progressing   Problem: Safety: Goal: Periods of time without injury will increase Outcome: Not Progressing   Problem: Education: Goal: Ability to verbalize precipitating factors for violent behavior will improve Outcome: Not Progressing   Problem: Coping: Goal: Ability to verbalize frustrations and anger appropriately will improve Outcome: Not Progressing   Problem: Health Behavior/Discharge  Planning: Goal: Ability to implement measures to prevent violent behavior in the future will improve Outcome: Not Progressing   Problem: Safety: Goal: Ability to demonstrate self-control will improve Outcome: Not Progressing Goal: Ability to redirect hostility and anger into socially appropriate behaviors will improve Outcome: Not Progressing   Problem: Activity: Goal: Will identify at  least one activity in which they can participate Outcome: Not Progressing   Problem: Coping: Goal: Ability to identify and develop effective coping behavior will improve Outcome: Not Progressing Goal: Ability to interact with others will improve Outcome: Not Progressing Goal: Demonstration of participation in decision-making regarding own care will improve Outcome: Not Progressing Goal: Ability to use eye contact when communicating with others will improve Outcome: Not Progressing   Problem: Health Behavior/Discharge Planning: Goal: Identification of resources available to assist in meeting health care needs will improve Outcome: Not Progressing   Problem: Self-Concept: Goal: Will verbalize positive feelings about self Outcome: Not Progressing   Problem: Activity: Goal: Will verbalize the importance of balancing activity with adequate rest periods Outcome: Not Progressing   Problem: Education: Goal: Will be free of psychotic symptoms Outcome: Not Progressing Goal: Knowledge of the prescribed therapeutic regimen will improve Outcome: Not Progressing   Problem: Coping: Goal: Coping ability will improve Outcome: Not Progressing Goal: Will verbalize feelings Outcome: Not Progressing   Problem: Health Behavior/Discharge Planning: Goal: Compliance with prescribed medication regimen will improve Outcome: Not Progressing   Problem: Nutritional: Goal: Ability to achieve adequate nutritional intake will improve Outcome: Not Progressing   Problem: Role Relationship: Goal: Ability to  communicate needs accurately will improve Outcome: Not Progressing Goal: Ability to interact with others will improve Outcome: Not Progressing   Problem: Safety: Goal: Ability to redirect hostility and anger into socially appropriate behaviors will improve Outcome: Not Progressing Goal: Ability to remain free from injury will improve Outcome: Not Progressing   Problem: Self-Care: Goal: Ability to participate in self-care as condition permits will improve Outcome: Not Progressing   Problem: Self-Concept: Goal: Will verbalize positive feelings about self Outcome: Not Progressing

## 2019-06-07 NOTE — Plan of Care (Signed)
Pt attended one group session in a calm and appropriate mood at completion of recreation therapy group sessions.   Caroll Rancher, LRT/CTRS

## 2019-06-07 NOTE — BHH Suicide Risk Assessment (Signed)
[facility] Discharge Suicide Risk Assessment   Principal Problem: This is the second psychiatric admission in about 6 months time for Angie Lopez, she is 62 we had diagnosed with new onset psychosis, probable schizophrenia in September 2020.  At that point in time she was suffering from delusional believes, possible hallucinations, but stabilized very well on lumateperone in addition to fluoxetine and neuro protective measures including omega-3's and metabolize B vitamins  Since discharge she apparently was noncompliant with his medication and follow-up, was also abusing cannabis daily, this led to a further exacerbation.  By the time she presented to the emergency department on 3/9 petition for involuntary commitment she was expressing delusional believes, physically combative kicking at the [facility] police and security so forth. During her days in the emergency department until admission here she was volatile, refusing care, threatening staff so forth.  Sleep is also been disrupted.  During her telepsych visit of 3/21 she was quite paranoid and agitated refusing to fully cooperate.  My exam this morning she basically refuses to fully cooperate, makes no eye contact stating she is fine and will not answer questions.  She is selectively mute.  Clearly paranoid and guarded and in need of treatment. Discharge Diagnoses: Active Problems:   Delusional disorder (HCC)   Total Time spent with patient: 45 minutes  Musculoskeletal: Strength & Muscle Tone: within normal limits Gait & Station: normal Patient leans: N/A  Psychiatric Specialty Exam: Review of Systems  Blood pressure 120/72, pulse 81, temperature 98.3 F (36.8 C), temperature source Oral, resp. rate 20, height 5' 9.25" (1.759 m), weight 52.6 kg, SpO2 97 %.Body mass index is 17.01 kg/m.  General Appearance: Casual  Eye Contact::  Fair  Speech:  Normal Rate409  Volume:  Increased  Mood:  Angry and Irritable  Affect:  Congruent  Thought  Process:  Irrelevant  Orientation:  Other:  Would not answer  Thought Content:  Illogical  Suicidal Thoughts:  No  Homicidal Thoughts:  No  Memory:  Immediate;   Poor Recent;   Fair Remote;   Fair  Judgement:  Impaired  Insight:  Lacking  Psychomotor Activity:  Normal  Concentration:  Poor  Recall:  Poor  Fund of Knowledge:Poor  Language: Sarcastic argumentative irritable  Akathisia:  Negative  Handed:  Right  AIMS (if indicated):     Assets:  Leisure Time Physical Health Resilience  Sleep:  Number of Hours: 6.25  Cognition: WNL  ADL's:  Intact   During the patient's hospital stay she consistently refused medication however we did get a forced medication consultation, long-acting injectable risperidone was administered and neck shot is due on around the 12th of the next month however patient states she will not comply with prescriptions at home.  After much discussion with the treatment team the patient denies suicidal and homicidal thoughts and is not behaving dangerously is simply refusing care.  She is discharged with follow-up arranged as discussed in the discharge summary  Mental Status Per Nursing Assessment::   On Admission:  Plan to harm others, Thoughts of violence towards others  Demographic Factors:  Unemployed  Loss Factors: Decrease in vocational status  Historical Factors: Impulsivity  Risk Reduction Factors:   NA  Continued Clinical Symptoms:  Previous Psychiatric Diagnoses and Treatments  Cognitive Features That Contribute To Risk:  Closed-mindedness and Polarized thinking    Suicide Risk:  Minimal: No identifiable suicidal ideation.  Patients presenting with no risk factors but with morbid ruminations; may be classified as minimal risk based  on the severity of the depressive symptoms  Follow-up Information    [facility] Follow up on 06/08/2019.   Why: You are scheduled for an appointment on 06/08/19 at 9:30 am.  This will be a virtual tele-health  appointment.   Contact information: [place] [address] [phone]          Johnn Hai, MD 06/07/2019, 8:10 AM

## 2019-06-07 NOTE — Progress Notes (Signed)
Recreation Therapy Notes  Date: 3.30.21 Time: 0950 Location: [address] Dayroom  Group Topic: Wellness  Goal Area(s) Addresses:  Patient will define components of whole wellness. Patient will verbalize benefit of whole wellness.  Behavioral Response: Engaged  Intervention: Music  Activity:  Exercise.  LRT led patients in a series of stretches to loosen up the muscles and body.  Each patient took turns in leading the group in an exercise of their choosing.  Patients were encouraged to take breaks and drink water as needed.  Education: Wellness, Building control surveyor.   Education Outcome: Acknowledges education/In group clarification offered/Needs additional education.   Clinical Observations/Feedback:  Pt was appropriate and social during group.  Pt interacted well with peers.  Pt was smiling throughout.  Pt even encouraged peers during activity.    Caroll Rancher, LRT/CTRS     Caroll Rancher A 06/07/2019 10:43 AM

## 2019-06-07 NOTE — Discharge Summary (Signed)
Physician Discharge Summary Note  Patient:  Angie Lopez is an 19 y.o., female MRN:  [id number] DOB:  Apr 15, 2000 Patient phone:  [phone] (home)  Patient address:   [address] [place] [address],  Total Time spent with patient: 15 minutes  Date of Admission:  05/31/2019 Date of Discharge: 06/07/19  Reason for Admission:  psychosis  Principal Problem: <principal problem not specified> Discharge Diagnoses: Active Problems:   Delusional disorder [facility])   Past Psychiatric History: New onset psychosis in September 2020 with psychiatric hospitalization at that time, started on lumateperone, fluoxetine, omega-3's, and B vitamins.  Past Medical History:  Past Medical History:  Diagnosis Date  . Acne 08/2011   epiduo 2012  . ADHD (attention deficit hyperactivity disorder) 2009   HTN on stimulants, off meds 03/2012  . Allergic conjunctivitis 2012  . Allergic rhinitis 08/2010  . Learning disorder 08/2010   borderline IQ, had IEP in 2012  . Obesity 08/13/2010  . Prediabetes    HBA1C 6.0 03/2010, never took metromin in 2012-2014  . SCFE (slipped capital femoral epiphysis) 2011    Past Surgical History:  Procedure Laterality Date  . HIP PINNING  12/2009   Family History:  Family History  Problem Relation Age of Onset  . Obesity Sister   . Obesity Brother   . Hypertension Maternal Grandmother   . Diabetes Maternal Grandmother   . Obesity Maternal Grandmother   . Hypertension Maternal Grandfather   . Obesity Mother   . Diabetes Maternal Aunt   . Obesity Maternal Aunt   . Diabetes Cousin   . Thyroid disease Neg Hx    Family Psychiatric  History: Denies Social History:  Social History   Substance and Sexual Activity  Alcohol Use Never  . Alcohol/week: 0.0 standard drinks     Social History   Substance and Sexual Activity  Drug Use Yes  . Types: Marijuana, Cocaine   Comment: pt denies current drug use    Social History   Socioeconomic History  . Marital  status: Single    Spouse name: Not on file  . Number of children: Not on file  . Years of education: Not on file  . Highest education level: Not on file  Occupational History  . Not on file  Tobacco Use  . Smoking status: Former Smoker    Types: Cigarettes  . Smokeless tobacco: Never Used  Substance and Sexual Activity  . Alcohol use: Never    Alcohol/week: 0.0 standard drinks  . Drug use: Yes    Types: Marijuana, Cocaine    Comment: pt denies current drug use  . Sexual activity: Not Currently    Birth control/protection: Implant    Comment: "taken out about a year ago. Don't need it now"  Other Topics Concern  . Not on file  Social History Narrative   Lives at home with mom and sister, attends [employer] is in 7th grade. [name].    Social Determinants of Health   Financial Resource Strain:   . Difficulty of Paying Living Expenses:   Food Insecurity:   . Worried About Programme researcher, broadcasting/film/video in the Last Year:   . Barista in the Last Year:   Transportation Needs:   . Freight forwarder (Medical):   Marland Kitchen Lack of Transportation (Non-Medical):   Physical Activity:   . Days of Exercise per Week:   . Minutes of Exercise per Session:   Stress:   . Feeling of Stress :  Social Connections:   . Frequency of Communication with Friends and Family:   . Frequency of Social Gatherings with Friends and Family:   . Attends Religious Services:   . Active Member of Clubs or Organizations:   . Attends Archivist Meetings:   Marland Kitchen Marital Status:     Hospital Course:  From admission H&P: This is the second psychiatric admission in about 6 months time for Ms. Angie Lopez, she is 59 we had diagnosed with new onset psychosis, probable schizophrenia in September 2020.  At that point in time she was suffering from delusional believes, possible hallucinations, but stabilized very well on lumateperone in addition to fluoxetine and neuro protective measures including omega-3's and  metabolize B vitamins. Since discharge she apparently was noncompliant with his medication and follow-up, was also abusing cannabis daily, this led to a further exacerbation.  By the time she presented to the emergency department on 3/9 petition for involuntary commitment she was expressing delusional believes, physically combative kicking at the [facility] police and security so forth. During her days in the emergency department until admission here she was volatile, refusing care, threatening staff so forth.  Sleep is also been disrupted. During her telepsych visit of 3/21 she was quite paranoid and agitated refusing to fully cooperate. My exam this morning she basically refuses to fully cooperate, makes no eye contact stating she is fine and will not answer questions.  She is selectively mute. Clearly paranoid and guarded and in need of treatment.  Ms. Slovacek was admitted under IVC from her mother for psychosis. She had been hospitalized here in September 2020 with new onset psychosis but did not continue taking medications after discharge. Patient's mother reports patient had been isolating to her room at home and volatile at times. She became physically aggressive at home when her mother called police and delusional on admission. She remained on the [facility] unit for seven days. She refused medications, and forced medication was ordered after consult. Risperdal was started, and she received Risperdal Consta on 06/04/19. She declined to participate in group therapy on the unit. She has shown improvement with calmer behavior on the unit. She remains irritable and argumentative with staff but shows no aggressive behaviors. She shows no signs of responding to internal stimuli. She poses no acute risk of harm to self or others. She denies any SI/HI/AVH and contracts for safety. She is scheduled for follow-up appointment at [facility] (see below). She is discharging on the medications listed below.  Patient is provided with  prescriptions for medications upon discharge. Her mother is picking her up for discharge home.  Physical Findings: AIMS: Facial and Oral Movements Muscles of Facial Expression: None, normal Lips and Perioral Area: None, normal Jaw: None, normal Tongue: None, normal,Extremity Movements Upper (arms, wrists, hands, fingers): None, normal Lower (legs, knees, ankles, toes): None, normal, Trunk Movements Neck, shoulders, hips: None, normal, Overall Severity Severity of abnormal movements (highest score from questions above): None, normal Incapacitation due to abnormal movements: None, normal Patient's awareness of abnormal movements (rate only patient's report): No Awareness, Dental Status Current problems with teeth and/or dentures?: No Does patient usually wear dentures?: No  CIWA:    COWS:     Musculoskeletal: Strength & Muscle Tone: within normal limits Gait & Station: normal Patient leans: N/A  Psychiatric Specialty Exam: Physical Exam  Nursing note and vitals reviewed. Constitutional: She is oriented to person, place, and time. She appears well-developed and well-nourished.  Cardiovascular: Normal rate.  Respiratory: Effort normal.  Neurological: She is alert and oriented to person, place, and time.    Review of Systems  Constitutional: Negative.   Respiratory: Negative for cough and shortness of breath.   Psychiatric/Behavioral: Negative for agitation, behavioral problems, dysphoric mood, hallucinations, self-injury, sleep disturbance and suicidal ideas. The patient is not nervous/anxious and is not hyperactive.     Blood pressure 120/72, pulse 81, temperature 98.3 F (36.8 C), temperature source Oral, resp. rate 20, height 5' 9.25" (1.759 m), weight 52.6 kg, SpO2 97 %.Body mass index is 17.01 kg/m.  See MD's discharge SRA      Has this patient used any form of tobacco in the last 30 days? (Cigarettes, Smokeless Tobacco, Cigars, and/or Pipes)  No  Blood Alcohol level:   Lab Results  Component Value Date   ETH <10 05/27/2019   ETH <10 11/19/2018    Metabolic Disorder Labs:  Lab Results  Component Value Date   HGBA1C 5.0 11/19/2018   MPG 97 11/19/2018   Lab Results  Component Value Date   PROLACTIN 44.1 (H) 11/19/2018   Lab Results  Component Value Date   CHOL 170 11/19/2018   TRIG 50 11/19/2018   HDL 49 11/19/2018   CHOLHDL 3.5 11/19/2018   VLDL 10 11/19/2018   LDLCALC 111 (H) 11/19/2018    See Psychiatric Specialty Exam and Suicide Risk Assessment completed by Attending Physician prior to discharge.  Discharge destination:  Home  Is patient on multiple antipsychotic therapies at discharge:  Yes,   Do you recommend tapering to monotherapy for antipsychotics?  Yes   Has Patient had three or more failed trials of antipsychotic monotherapy by history:  No  Recommended Plan for Multiple Antipsychotic Therapies: Taper to monotherapy as described:  Patient was given LAI prior to discharge due to concerns for medication non-compliance. Outpatient provider may taper to antipsychotic monotherapy as patient's symptoms allow.   Allergies as of 06/07/2019   No Known Allergies     Medication List    TAKE these medications     Indication  Caplyta 42 MG Caps Generic drug: Lumateperone Tosylate Take 42 mg by mouth daily.  Indication: Schizophrenia   FLUoxetine 20 MG capsule Commonly known as: PROzac Take 1 capsule (20 mg total) by mouth daily.  Indication: Depression   omega-3 acid ethyl esters 1 g capsule Commonly known as: LOVAZA Take 1 capsule (1 g total) by mouth 2 (two) times daily.  Indication: High Amount of Triglycerides in the Blood   prenatal multivitamin Tabs tablet Take 1 tablet by mouth daily at 12 noon.  Indication: Pregnancy   risperiDONE microspheres 37.5 MG injection Commonly known as: RISPERDAL CONSTA Inject 2 mLs (37.5 mg total) into the muscle every 14 (fourteen) days. Due 4/12 Start taking on: June 18, 2019   Indication: Schizophrenia   temazepam 30 MG capsule Commonly known as: RESTORIL Take 1 capsule (30 mg total) by mouth at bedtime.  Indication: Trouble Sleeping      Follow-up Information    [facility] Follow up on 06/08/2019.   Why: You are scheduled for an appointment on 06/08/19 at 9:30 am.  This will be a virtual tele-health appointment.   Contact information: [address] [place] [address] [phone]           Follow-up recommendations: Activity as tolerated. Diet as recommended by primary care physician. Keep all scheduled follow-up appointments as recommended.   Comments:   Patient is instructed to take all prescribed medications as recommended. Report any side effects or  adverse reactions to your outpatient psychiatrist. Patient is instructed to abstain from alcohol and illegal drugs while on prescription medications. In the event of worsening symptoms, patient is instructed to call the crisis hotline, 911, or go to the nearest emergency department for evaluation and treatment.  Signed: Aldean Baker, NP 06/07/2019, 9:00 AM

## 2019-06-24 ENCOUNTER — Telehealth: Payer: Self-pay | Admitting: Clinical

## 2019-06-24 NOTE — Telephone Encounter (Signed)
TC from mother requesting resources and information for her daughter.  Telehealth visit on 06/08/19 but daughter did not complete it.  Per mother, pt refused injection on 06/18/19 that was going to be given by mother.  Mother requested support and resources. Emailed the following information to mother at [email or web address] per mother's request.  For students in [facility] - Refer them to their special education [facility] for support  Product manager of Health and [employer] [phone] [place] [phone]   Social Workers provide comprehensive ongoing case management services to adults that have been adjudicated incompetent by the - [phone] and for whom the [employer] has been appointed legal guardian.  Websites: Production assistant, radio  Guardianship forms (application will need to be submitted to the [facility] for [facility] residents and for other counties, in their respective county court house.  [facility] [address]  [place], [place]  Telephone Main  [phone]   If you need to talk to a lawyer - [facility] can also assist you in talking to a lawyer for a free 15 minute consultation.  [facility]  [address] [place], [place], [place]  Phone: [phone]

## 2019-07-22 ENCOUNTER — Encounter: Payer: Self-pay | Admitting: Pediatrics

## 2019-11-15 ENCOUNTER — Emergency Department (HOSPITAL_COMMUNITY)
Admission: EM | Admit: 2019-11-15 | Discharge: 2019-11-16 | Disposition: A | Discharge status: Other Acute Inpatient Hospital | Payer: Medicaid Other | Attending: Emergency Medicine | Admitting: Emergency Medicine

## 2019-11-15 ENCOUNTER — Other Ambulatory Visit: Payer: Self-pay

## 2019-11-15 DIAGNOSIS — Z20822 Contact with and (suspected) exposure to covid-19: Secondary | ICD-10-CM | POA: Insufficient documentation

## 2019-11-15 DIAGNOSIS — Z87891 Personal history of nicotine dependence: Secondary | ICD-10-CM | POA: Insufficient documentation

## 2019-11-15 DIAGNOSIS — R7303 Prediabetes: Secondary | ICD-10-CM | POA: Insufficient documentation

## 2019-11-15 DIAGNOSIS — Z79899 Other long term (current) drug therapy: Secondary | ICD-10-CM | POA: Insufficient documentation

## 2019-11-15 DIAGNOSIS — I10 Essential (primary) hypertension: Secondary | ICD-10-CM | POA: Insufficient documentation

## 2019-11-15 DIAGNOSIS — R45851 Suicidal ideations: Secondary | ICD-10-CM | POA: Insufficient documentation

## 2019-11-15 DIAGNOSIS — R456 Violent behavior: Secondary | ICD-10-CM | POA: Insufficient documentation

## 2019-11-15 DIAGNOSIS — F29 Unspecified psychosis not due to a substance or known physiological condition: Secondary | ICD-10-CM | POA: Insufficient documentation

## 2019-11-15 LAB — SALICYLATE LEVEL: Salicylate Lvl: <7 mg/dL — ABNORMAL LOW (ref 7.0–30.0)

## 2019-11-15 LAB — COMPREHENSIVE METABOLIC PANEL
ALT: 20 U/L (ref 0–44)
AST: 20 U/L (ref 15–41)
Albumin: 4.6 g/dL (ref 3.5–5.0)
Alkaline Phosphatase: 55 U/L (ref 38–126)
Anion gap: 12 (ref 5–15)
BUN: 12 mg/dL (ref 6–20)
CO2: 23 mmol/L (ref 22–32)
Calcium: 10 mg/dL (ref 8.9–10.3)
Chloride: 109 mmol/L (ref 98–111)
Creatinine, Ser: 0.77 mg/dL (ref 0.44–1.00)
GFR calc Af Amer: >60 mL/min (ref >60)
GFR calc non Af Amer: >60 mL/min (ref >60)
Glucose, Bld: 100 mg/dL — ABNORMAL HIGH (ref 70–99)
Potassium: 3.6 mmol/L (ref 3.5–5.1)
Sodium: 144 mmol/L (ref 135–145)
Total Bilirubin: 0.9 mg/dL (ref 0.3–1.2)
Total Protein: 7.7 g/dL (ref 6.5–8.1)

## 2019-11-15 LAB — CBC
HCT: 37.6 % (ref 36.0–46.0)
Hemoglobin: 12.6 g/dL (ref 12.0–15.0)
MCH: 29.2 pg (ref 26.0–34.0)
MCHC: 33.5 g/dL (ref 30.0–36.0)
MCV: 87.2 fL (ref 80.0–100.0)
Platelets: 270 10*3/uL (ref 150–400)
RBC: 4.31 MIL/uL (ref 3.87–5.11)
RDW: 13.2 % (ref 11.5–15.5)
WBC: 8.4 10*3/uL (ref 4.0–10.5)
nRBC: 0 % (ref 0.0–0.2)

## 2019-11-15 LAB — ACETAMINOPHEN LEVEL: Acetaminophen (Tylenol), Serum: <10 ug/mL — ABNORMAL LOW (ref 10–30)

## 2019-11-15 LAB — I-STAT BETA HCG BLOOD, ED (MC, WL, AP ONLY): I-stat hCG, quantitative: <5 m[IU]/mL (ref <5)

## 2019-11-15 LAB — ETHANOL: Alcohol, Ethyl (B): <10 mg/dL (ref <10)

## 2019-11-15 NOTE — ED Triage Notes (Signed)
Patient reports to the ER under IVC. Patient is reportedly not taking her medication, attempted to commit suicide by trying to force police to shoot her, and was fighting with Police in the [place]s [place]. Patient is delusional and stating that the police "are horny" and "taking her away".  Patient's mother took out IVC paperwork.

## 2019-11-15 NOTE — ED Provider Notes (Signed)
[facility]-EMERGENCY DEPT Provider Note   CSN: [id number] Arrival date & time: 11/15/19  2045     History Chief Complaint  Patient presents with  . IVC  . Aggressive Behavior  . Suicidal    Angie Lopez is a 19 y.o. female.  HPI Level 5 caveat due to psychiatric disorder. Patient brought in by police.  Reportedly had been arrested today for larceny at a [employer].  Had refused to leave and was arrested.  Later had been making suicidal statements and was attempting to get the police to shoot her.  Patient had been IVC by her mother.  Reportedly has psychiatric history and supposed be on Risperdal but is but not taking her medicines.  Patient will not really provide any history.  Reportedly had been making statements that everyone was out to get her.    Past Medical History:  Diagnosis Date  . Acne 08/2011   epiduo 2012  . ADHD (attention deficit hyperactivity disorder) 2009   HTN on stimulants, off meds 03/2012  . Allergic conjunctivitis 2012  . Allergic rhinitis 08/2010  . Learning disorder 08/2010   borderline IQ, had IEP in 2012  . Obesity 08/13/2010  . Prediabetes    HBA1C 6.0 03/2010, never took metromin in 2012-2014  . SCFE (slipped capital femoral epiphysis) 2011    Patient Active Problem List   Diagnosis Date Noted  . Bipolar I disorder with mania (HCC) 05/29/2019  . Delusional disorder (HCC) 11/19/2018  . Psychosis (HCC) 11/18/2018  . Failed hearing screening 05/27/2016  . Failed vision screen 05/27/2016  . Adjustment disorder with depressed mood 09/01/2014  . Aggressive behavior 10/29/2012  . Presence of subdermal contraceptive device 09/22/2012    Past Surgical History:  Procedure Laterality Date  . HIP PINNING  12/2009     OB History   No obstetric history on file.     Family History  Problem Relation Age of Onset  . Obesity Sister   . Obesity Brother   . Hypertension Maternal Grandmother   . Diabetes Maternal Grandmother    . Obesity Maternal Grandmother   . Hypertension Maternal Grandfather   . Obesity Mother   . Diabetes Maternal Aunt   . Obesity Maternal Aunt   . Diabetes Cousin   . Thyroid disease Neg Hx     Social History   Tobacco Use  . Smoking status: Former Smoker    Types: Cigarettes  . Smokeless tobacco: Never Used  Vaping Use  . Vaping Use: Never used  Substance Use Topics  . Alcohol use: Never    Alcohol/week: 0.0 standard drinks  . Drug use: Yes    Types: Marijuana, Cocaine    Comment: pt denies current drug use    Home Medications Prior to Admission medications   Medication Sig Start Date End Date Taking? Authorizing Provider  risperiDONE microspheres (RISPERDAL CONSTA) 37.5 MG injection Inject 2 mLs (37.5 mg total) into the muscle every 14 (fourteen) days. Due 4/12 06/18/19  Yes Malvin Johns, MD  FLUoxetine (PROZAC) 20 MG capsule Take 1 capsule (20 mg total) by mouth daily. Patient not taking: Reported on 11/17/2018 04/15/17   Verneda Skill, FNP  Lumateperone Tosylate (CAPLYTA) 42 MG CAPS Take 42 mg by mouth daily. Patient not taking: Reported on 01/20/2019 11/22/18   Malvin Johns, MD  omega-3 acid ethyl esters (LOVAZA) 1 g capsule Take 1 capsule (1 g total) by mouth 2 (two) times daily. Patient not taking: Reported on 01/20/2019 11/22/18  Malvin Johns, MD  Prenatal Vit-Fe Fumarate-FA (PRENATAL MULTIVITAMIN) TABS tablet Take 1 tablet by mouth daily at 12 noon. Patient not taking: Reported on 01/20/2019 11/22/18   Malvin Johns, MD  temazepam (RESTORIL) 30 MG capsule Take 1 capsule (30 mg total) by mouth at bedtime. Patient not taking: Reported on 05/27/2019 11/22/18   Malvin Johns, MD    Allergies    Patient has no known allergies.  Review of Systems   Review of Systems  Unable to perform ROS: Psychiatric disorder    Physical Exam Updated Vital Signs BP 126/83 (BP Location: Left Arm)   Pulse 100   Temp 98.6 F (37 C) (Oral)   Resp 16   SpO2 100%   Physical  Exam Vitals and nursing note reviewed.  HENT:     Head: Normocephalic.  Eyes:     Pupils: Pupils are equal, round, and reactive to light.  Cardiovascular:     Rate and Rhythm: Regular rhythm.  Pulmonary:     Effort: Pulmonary effort is normal.  Abdominal:     Tenderness: There is no abdominal tenderness.  Musculoskeletal:        General: No tenderness.  Skin:    General: Skin is warm.     Capillary Refill: Capillary refill takes less than 2 seconds.  Neurological:     Mental Status: She is alert.     Comments: Patient is somewhat pressured.  Moves all extremities but cannot provide too much history.     ED Results / Procedures / Treatments   Labs (all labs ordered are listed, but only abnormal results are displayed) Labs Reviewed  COMPREHENSIVE METABOLIC PANEL - Abnormal; Notable for the following components:      Result Value   Glucose, Bld 100 (*)    All other components within normal limits  SARS CORONAVIRUS 2 BY RT PCR (HOSPITAL ORDER, PERFORMED IN [facility])  CBC  ETHANOL  SALICYLATE LEVEL  ACETAMINOPHEN LEVEL  RAPID URINE DRUG SCREEN, HOSP PERFORMED  I-STAT BETA HCG BLOOD, ED ([facility], [facility], [facility] ONLY)    EKG None  Radiology No results found.  Procedures Procedures (including critical care time)  Medications Ordered in ED Medications - No data to display  ED Course  I have reviewed the triage vital signs and the nursing notes.  Pertinent labs & imaging results that were available during my care of the patient were reviewed by me and considered in my medical decision making (see chart for details).    MDM Rules/Calculators/A&P                          Patient brought in under IVC by her mother.  Reportedly had been making suicidal statements and attempted to get the police to shoot her.  Reportedly noncompliant with the medicines.  Labs pending but patient is medically cleared.  CBC CMP and pregnancy test reassuring.  Patient is medically  cleared.  Covid test still pending however Will put in for patient to be seen by TTS. Final Clinical Impression(s) / ED Diagnoses Final diagnoses:  Psychosis, unspecified psychosis type [facility])    Rx / DC Orders ED Discharge Orders    None       Benjiman Core, MD 11/15/19 2252

## 2019-11-16 ENCOUNTER — Encounter (HOSPITAL_COMMUNITY): Payer: Self-pay | Admitting: Registered Nurse

## 2019-11-16 ENCOUNTER — Inpatient Hospital Stay (HOSPITAL_COMMUNITY)
Admission: AD | Admit: 2019-11-16 | Discharge: 2019-11-22 | DRG: 885 | Disposition: A | Discharge status: Home / Self Care | Payer: Medicaid Other | Attending: Psychiatry | Admitting: Psychiatry

## 2019-11-16 ENCOUNTER — Other Ambulatory Visit: Payer: Self-pay | Admitting: Registered Nurse

## 2019-11-16 DIAGNOSIS — F22 Delusional disorders: Secondary | ICD-10-CM | POA: Diagnosis present

## 2019-11-16 DIAGNOSIS — R4689 Other symptoms and signs involving appearance and behavior: Secondary | ICD-10-CM | POA: Diagnosis not present

## 2019-11-16 DIAGNOSIS — Z87891 Personal history of nicotine dependence: Secondary | ICD-10-CM

## 2019-11-16 DIAGNOSIS — F3110 Bipolar disorder, current episode manic without psychotic features, unspecified: Secondary | ICD-10-CM | POA: Diagnosis present

## 2019-11-16 DIAGNOSIS — F29 Unspecified psychosis not due to a substance or known physiological condition: Secondary | ICD-10-CM | POA: Diagnosis present

## 2019-11-16 DIAGNOSIS — Z79899 Other long term (current) drug therapy: Secondary | ICD-10-CM

## 2019-11-16 DIAGNOSIS — Z20822 Contact with and (suspected) exposure to covid-19: Secondary | ICD-10-CM | POA: Diagnosis present

## 2019-11-16 DIAGNOSIS — F319 Bipolar disorder, unspecified: Principal | ICD-10-CM | POA: Diagnosis present

## 2019-11-16 DIAGNOSIS — F1210 Cannabis abuse, uncomplicated: Secondary | ICD-10-CM | POA: Diagnosis present

## 2019-11-16 DIAGNOSIS — R45851 Suicidal ideations: Secondary | ICD-10-CM | POA: Diagnosis present

## 2019-11-16 DIAGNOSIS — F250 Schizoaffective disorder, bipolar type: Secondary | ICD-10-CM | POA: Diagnosis not present

## 2019-11-16 DIAGNOSIS — G4700 Insomnia, unspecified: Secondary | ICD-10-CM | POA: Diagnosis present

## 2019-11-16 DIAGNOSIS — F19959 Other psychoactive substance use, unspecified with psychoactive substance-induced psychotic disorder, unspecified: Secondary | ICD-10-CM | POA: Diagnosis not present

## 2019-11-16 DIAGNOSIS — F419 Anxiety disorder, unspecified: Secondary | ICD-10-CM | POA: Diagnosis present

## 2019-11-16 DIAGNOSIS — Z9114 Patient's other noncompliance with medication regimen: Secondary | ICD-10-CM

## 2019-11-16 LAB — SARS CORONAVIRUS 2 BY RT PCR (HOSPITAL ORDER, PERFORMED IN ~~LOC~~ HOSPITAL LAB): SARS Coronavirus 2: NEGATIVE

## 2019-11-16 MED ORDER — ARIPIPRAZOLE 10 MG PO TABS
10.0000 mg | ORAL_TABLET | Freq: Every day | ORAL | Status: DC
  Filled 2019-11-16: qty 1

## 2019-11-16 NOTE — ED Notes (Signed)
Continues to be irritable about being here and not cooperative. Stays in her room. Sitter visualizing.

## 2019-11-16 NOTE — ED Notes (Signed)
This writer attempted to give pt Abilify but she would not take it because she feels that her rights are being violated by being held here. She would not sign consent for her mother to receive information. She denied stealing anything from [employer] "Because I had money in my pocket. Why would I do that?"  Denied asking the police to shoot her. Pt was argumentative and would not listen to any explanations about IVC.

## 2019-11-16 NOTE — ED Notes (Signed)
Continues to be irritable and argumentative about being here. Stays in her room so far. Refused VS. "I do not give my consent."

## 2019-11-16 NOTE — ED Notes (Signed)
Pt being transported from TCU room 28 to [facility] room 500-1 by [facility] officers x2
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
## 2019-11-16 NOTE — BH Assessment (Addendum)
[facility] Assessment Progress Note  Per [name] Rankin, NP, this pt requires psychiatric hospitalization.  Percell Boston, RN has assigned pt to [facility] Rm 500-1; [facility] will be ready to receive pt at 20:00.  Pt presents under IVC initiated by pt's mother, and upheld by EDP Benjiman Core, MD, and IVC documents have been faxed to [facility].  Pt's nurse, Diane, has been notified, and agrees to call report to [phone].  Pt is to be transported via [employer].   Doylene Canning, [place] Behavioral Health Coordinator [phone]

## 2019-11-16 NOTE — Consult Note (Signed)
Angie Lopez, 19 y.o., female patient seen via tele psych by this provider, Dr. Lucianne Muss; and chart reviewed on 11/16/19.  On evaluation Angie Lopez reports she is in the hospital "because I was being done wrong.  I was asking for help like for resources but I didn't get it.  I can't get help at home.  I don't need help in the hospital but a person that can help me find a job.  I'm a grown woman and I need grown woman resources."  When asked what happened with the police yesterday, patient stated "They was harming me and dragging me like I was weak, so I tried to stand up for myself.  They was trying to hurt me."  Patient states that she did not try to get the police to shoot her or ask for the police to shoot her.  Patient states that she lives with her mother but her mother is not supportive.  When asked if she was hearing or seeing thing not there patient stated "I am not sick.  I just need somebody I can trust.  I don't have anybody, there is no body there for me."  Patient denies illicit drug use.  Patient states that she is not currently on any psychotropic medications and has no outpatient psychiatric services. Patient admitted to [facility] 3/21 for similar circumstances and was started on Risperdal Consta but has not been taking medication.  Patient gave permission to speak to her mother for collateral information.   During evaluation Angie Lopez is alert/oriented x 3; calm/cooperative; Patient asked the name of current president patient stated "I don't know; I'm not sick, I don't need to be here."  Patient is not a good historian, restricted with information, and labile.   Patient denies suicidal/self-harm/homicidal ideation, psychosis, and paranoia; but seems to be minimizing situation and avoiding answering questions.  Patient gave permission to speak to her mother for collateral information.    Patient has been hospitalized twice for similar circumstances for the first time 11/2018 and again  05/2019.  History of delusional disorder and auditory hallucination.  Patient noncompliant with medication which causes exacerbation of symptoms.  Collateral Information:  Spoke to patients mother Angie Lopez at [phone].  Patient mother states that patient has had no medications since her last inpatient visit.  Patient is suppose to follow up at [facility] but has not been.  "A lady from [place] called one time and tried to talk to her but she cussed the lady out and nobody has called since.  Every 2 weeks I get a text message about medication refill that I need to pick up at the pharmacy and I go pick it up but she won't take it."  Mother stated that the medication was kept in the refrigerator "Risperdal"  Suppose to take every 2 weeks.  I think the last time she might have taken it was 10/20/19.  Mother also reports that patient has been "sneaking out of the house; she has been stealing but thinks that it is okay and nothing is wrong with it.  I am limited to what I can do because of her age.  I've wanted to get guardianship but don't know who I need to talk to.  She is talking to herself, she is getting more aggressive.  Right now she thinks I'm a man and has called me every name in the book; she is just getting progressively worse and when she is not on her medication I can't  handle her."  Mother is interested in getting information as to steps need to take for guardianship.  States that patient would be better with a monthly injection.  Also discussed ACT services for patient.    Recommendation: Social work consult speak with mother on steps/resources for guardianship, care coordinator.  Will start patient on Abilify 10 mg daily if no adverse reaction can start Abilify Maintena.    Disposition: Recommend psychiatric Inpatient admission when medically cleared.   If no psychiatric bed found.  Psychiatry will assess Abilify PO for adverse reaction and if no reaction start Abilify Maintena.

## 2019-11-16 NOTE — BH Assessment (Signed)
[facility] Assessment Progress Note  Per [name] Rankin, NP, this pt requires psychiatric hospitalization at this time.  Pt presents under IVC initiated by pt's mother and upheld by EDP Benjiman Core, MD.  This writer will seek placement for pt.  At [facility] request, I called pt's mother, Laurel Dimmer [phone]), to answer questions that she had posed to [facility] about guardianship.  Call was placed at 11:26.  I explained to her that in order to start the process, she would need to present at the [place] of Courts office to fill out an Adjudication of Incompetence and Assignment of Guardianship form, and that from there, the court would determine what is in the best interest of the pt.  I also gave her the [facility] website related to this topic.  Additionally I explained how the IVC process works in this state.  Per pt's nurse, Diane, pt is not willing to consent for information to be released to her mother at this time, so I did not share any dispositional information with the mother, who was understanding about HIPAA and patient privacy.  Doylene Canning, [place] Behavioral Health Coordinator [phone]

## 2019-11-16 NOTE — ED Notes (Signed)
PT REFUSED VITALS °

## 2019-11-16 NOTE — ED Notes (Signed)
PT REFUSED DINNER TRAY

## 2019-11-16 NOTE — ED Notes (Signed)
[facility] called for transport to [facility] room 500-1

## 2019-11-17 ENCOUNTER — Encounter (HOSPITAL_COMMUNITY): Payer: Self-pay | Admitting: Registered Nurse

## 2019-11-17 ENCOUNTER — Other Ambulatory Visit: Payer: Self-pay

## 2019-11-17 DIAGNOSIS — F250 Schizoaffective disorder, bipolar type: Secondary | ICD-10-CM

## 2019-11-17 MED ORDER — ACETAMINOPHEN 325 MG PO TABS: 650.0000 mg | ORAL_TABLET | Freq: Four times a day (QID) | ORAL | Status: DC | PRN

## 2019-11-17 MED ORDER — RISPERIDONE 2 MG PO TBDP
2.0000 mg | ORAL_TABLET | Freq: Every day | ORAL | Status: DC
  Administered 2019-11-17 – 2019-11-20 (×3): 2 mg via ORAL
  Filled 2019-11-17 (×5): qty 1

## 2019-11-17 MED ORDER — MAGNESIUM HYDROXIDE 400 MG/5ML PO SUSP: 30.0000 mL | Freq: Every day | ORAL | Status: DC | PRN

## 2019-11-17 MED ORDER — RISPERIDONE 1 MG PO TBDP
1.0000 mg | ORAL_TABLET | Freq: Every day | ORAL | Status: DC
  Administered 2019-11-17 – 2019-11-22 (×5): 1 mg via ORAL
  Filled 2019-11-17 (×9): qty 1

## 2019-11-17 MED ORDER — RISPERIDONE 2 MG PO TBDP: 2.0000 mg | ORAL_TABLET | Freq: Three times a day (TID) | ORAL | Status: DC | PRN

## 2019-11-17 MED ORDER — LORAZEPAM 1 MG PO TABS: 1.0000 mg | ORAL_TABLET | ORAL | Status: DC | PRN

## 2019-11-17 MED ORDER — ARIPIPRAZOLE 10 MG PO TABS
10.0000 mg | ORAL_TABLET | Freq: Every day | ORAL | Status: DC
  Filled 2019-11-17 (×2): qty 1

## 2019-11-17 MED ORDER — TRAZODONE HCL 50 MG PO TABS
50.0000 mg | ORAL_TABLET | Freq: Every evening | ORAL | Status: DC | PRN
  Administered 2019-11-17: 50 mg via ORAL
  Filled 2019-11-17 (×3): qty 1

## 2019-11-17 MED ORDER — HYDROXYZINE HCL 25 MG PO TABS
25.0000 mg | ORAL_TABLET | Freq: Three times a day (TID) | ORAL | Status: DC | PRN
  Administered 2019-11-17: 25 mg via ORAL
  Filled 2019-11-17 (×2): qty 1

## 2019-11-17 MED ORDER — ZIPRASIDONE MESYLATE 20 MG IM SOLR: 20.0000 mg | INTRAMUSCULAR | Status: DC | PRN

## 2019-11-17 MED ORDER — ALUM & MAG HYDROXIDE-SIMETH 200-200-20 MG/5ML PO SUSP: 30.0000 mL | ORAL | Status: DC | PRN

## 2019-11-17 NOTE — H&P (Signed)
Psychiatric Admission Assessment Adult  Patient Identification: Angie RevelsDemetria Malanowski MRN:  [id number] Date of Evaluation:  11/17/2019 Chief Complaint:  Delusional disorder (HCC) [F22] Psychosis (HCC) [F29] Principal Diagnosis: <principal problem not specified> Diagnosis:  Active Problems:   Psychosis (HCC)   Delusional disorder (HCC)  History of Present Illness: Patient is seen and examined.  Patient is a 19 year old female with a previous psychiatric history significant for delusional disorder versus acute psychosis versus most probably schizophrenia who [facility] to [facility] emergency department on 11/15/2019 after having been brought in by police.  The patient was arrested for larceny at a [employer] store.  She refused to leave and was arrested.  It was also reported that later she had been making suicidal statements and was attempting to get the police to shoot her.  She was assessed by the comprehensive clinical assessment note and stated she was being followed by some girls whom wanted to find her.  She had shown them money, and put items back then the girls lied to the officers about the theft.  According to the involuntary commitment per the police she had asked the police to shoot her.  The patient denied any knowledge of that in the emergency room.  Today she is irritable, refusing to answer questions, and stated "I am not been take any medicine because there is nothing wrong with me".  She has had 2 previous psychiatric hospitalizations at our facility.  Her most recent hospitalization here was from 3/23 to 06/07/2019.  It was felt that she was suffering probably from schizophrenia.  She had been placed on Caplyta as well as receiving a Risperdal Consta injection on 06/04/2019.  It noted in the discharge summary that she was calmer at discharge.  According to the notes the patient was noncompliant with medications.  It looks like the last time she had had the Risperdal  injection was when she was in the hospital last.  On examination today she is paranoid, agitated, irritable.  She was admitted to the hospital for evaluation and stabilization.  Associated Signs/Symptoms: Depression Symptoms:  insomnia, anxiety, disturbed sleep, (Hypo) Manic Symptoms:  Delusions, Impulsivity, Irritable Mood, Labiality of Mood, Anxiety Symptoms:  Excessive Worry, Psychotic Symptoms:  Delusions, Paranoia, PTSD Symptoms: Negative Total Time spent with patient: 45 minutes  Past Psychiatric History: It appears this is the third psychiatric hospitalization for this female.  Her last admission was on 06/01/2019.  She has been diagnosed with schizophrenia.  She has been previously treated with Caplyta, Risperdal Consta, fluoxetine, temazepam.  She has a reported history of cannabis use, but unfortunately drug screen has not been obtained as of this admission.  Is the patient at risk to self? Yes.    Has the patient been a risk to self in the past 6 months? Yes.    Has the patient been a risk to self within the distant past? Yes.    Is the patient a risk to others? No.  Has the patient been a risk to others in the past 6 months? No.  Has the patient been a risk to others within the distant past? No.   Prior Inpatient Therapy:   Prior Outpatient Therapy:    Alcohol Screening: 1. How often do you have a drink containing alcohol?: Never 2. How many drinks containing alcohol do you have on a typical day when you are drinking?: 1 or 2 3. How often do you have six or more drinks on one occasion?: Never AUDIT-C  Score: 0 Alcohol Brief Interventions/Follow-up: AUDIT Score <7 follow-up not indicated Substance Abuse History in the last 12 months:  Yes.   Consequences of Substance Abuse: Negative Previous Psychotropic Medications: Yes  Psychological Evaluations: Yes  Past Medical History:  Past Medical History:  Diagnosis Date   Acne 08/2011   epiduo 2012   ADHD (attention  deficit hyperactivity disorder) 2009   HTN on stimulants, off meds 03/2012   Allergic conjunctivitis 2012   Allergic rhinitis 08/2010   Learning disorder 08/2010   borderline IQ, had IEP in 2012   Obesity 08/13/2010   Prediabetes    HBA1C 6.0 03/2010, never took metromin in 2012-2014   SCFE (slipped capital femoral epiphysis) 2011    Past Surgical History:  Procedure Laterality Date   HIP PINNING  12/2009   Family History:  Family History  Problem Relation Age of Onset   Obesity Sister    Obesity Brother    Hypertension Maternal Grandmother    Diabetes Maternal Grandmother    Obesity Maternal Grandmother    Hypertension Maternal Grandfather    Obesity Mother    Diabetes Maternal Aunt    Obesity Maternal Aunt    Diabetes Cousin    Thyroid disease Neg Hx    Family Psychiatric  History: Patient denied. Tobacco Screening: Have you used any form of tobacco in the last 30 days? (Cigarettes, Smokeless Tobacco, Cigars, and/or Pipes): Patient Refused Screening Social History:  Social History   Substance and Sexual Activity  Alcohol Use Never   Alcohol/week: 0.0 standard drinks     Social History   Substance and Sexual Activity  Drug Use Not Currently   Types: Marijuana, Cocaine   Comment: pt denies current drug use    Additional Social History:                           Allergies:  No Known Allergies Lab Results:  Results for orders placed or performed during the hospital encounter of 11/15/19 (from the past 48 hour(s))  Comprehensive metabolic panel     Status: Abnormal   Collection Time: 11/15/19 10:17 PM  Result Value Ref Range   Sodium 144 135 - 145 mmol/L   Potassium 3.6 3.5 - 5.1 mmol/L   Chloride 109 98 - 111 mmol/L   CO2 23 22 - 32 mmol/L   Glucose, Bld 100 (H) 70 - 99 mg/dL    Comment: Glucose reference range applies only to samples taken after fasting for at least 8 hours.   BUN 12 6 - 20 mg/dL   Creatinine, Ser 5.63 0.44 - 1.00  mg/dL   Calcium 87.5 8.9 - 64.3 mg/dL   Total Protein 7.7 6.5 - 8.1 g/dL   Albumin 4.6 3.5 - 5.0 g/dL   AST 20 15 - 41 U/L   ALT 20 0 - 44 U/L   Alkaline Phosphatase 55 38 - 126 U/L   Total Bilirubin 0.9 0.3 - 1.2 mg/dL   GFR calc non Af Amer >60 >60 mL/min   GFR calc Af Amer >60 >60 mL/min   Anion gap 12 5 - 15    Comment: Performed at [facility], [address] [place], [place] [address]  Ethanol     Status: None   Collection Time: 11/15/19 10:17 PM  Result Value Ref Range   Alcohol, Ethyl (B) <10 <10 mg/dL    Comment: (NOTE) Lowest detectable limit for serum alcohol is 10 mg/dL.  For  medical purposes only. Performed at [facility], [address], [place], [place] [address]   Salicylate level     Status: Abnormal   Collection Time: 11/15/19 10:17 PM  Result Value Ref Range   Salicylate Lvl <7.0 (L) 7.0 - 30.0 mg/dL    Comment: Performed at [facility], [address], [place], [place] [address]  Acetaminophen level     Status: Abnormal   Collection Time: 11/15/19 10:17 PM  Result Value Ref Range   Acetaminophen (Tylenol), Serum <10 (L) 10 - 30 ug/mL    Comment: (NOTE) Therapeutic concentrations vary significantly. A range of 10-30 ug/mL  may be an effective concentration for many patients. However, some  are best treated at concentrations outside of this range. Acetaminophen concentrations >150 ug/mL at 4 hours after ingestion  and >50 ug/mL at 12 hours after ingestion are often associated with  toxic reactions.  Performed at [facility], [address], [place], [place] [address]   cbc     Status: None   Collection Time: 11/15/19 10:17 PM  Result Value Ref Range   WBC 8.4 4.0 - 10.5 K/uL   RBC 4.31 3.87 - 5.11 MIL/uL   Hemoglobin 12.6 12.0 - 15.0 g/dL   HCT 91.4 36 - 46 %   MCV 87.2 80.0 - 100.0 fL   MCH 29.2 26.0 - 34.0 pg   MCHC 33.5 30.0 - 36.0 g/dL   RDW 78.2 95.6 - 21.3 %    Platelets 270 150 - 400 K/uL   nRBC 0.0 0.0 - 0.2 %    Comment: Performed at [facility], [address], [place], [place] [address]  SARS Coronavirus 2 by RT PCR (hospital order, performed in [facility] hospital lab) Nasopharyngeal Nasopharyngeal Swab     Status: None   Collection Time: 11/15/19 10:21 PM   Specimen: Nasopharyngeal Swab  Result Value Ref Range   SARS Coronavirus 2 NEGATIVE NEGATIVE    Comment: (NOTE) SARS-CoV-2 target nucleic acids are NOT DETECTED.  The SARS-CoV-2 RNA is generally detectable in upper and lower respiratory specimens during the acute phase of infection. The lowest concentration of SARS-CoV-2 viral copies this assay can detect is 250 copies / mL. A negative result does not preclude SARS-CoV-2 infection and should not be used as the sole basis for treatment or other patient management decisions.  A negative result may occur with improper specimen collection / handling, submission of specimen other than nasopharyngeal swab, presence of viral mutation(s) within the areas targeted by this assay, and inadequate number of viral copies (<250 copies / mL). A negative result must be combined with clinical observations, patient history, and epidemiological information.  Fact Sheet for Patients:   BoilerBrush.com.cy  Fact Sheet for Healthcare Providers: https://pope.com/  This test is not yet approved or  cleared by the [place] FDA and has been authorized for detection and/or diagnosis of SARS-CoV-2 by FDA under an Emergency Use Authorization (EUA).  This EUA will remain in effect (meaning this test can be used) for the duration of the COVID-19 declaration under Section 564(b)(1) of the Act, 21 U.S.C. section 360bbb-3(b)(1), unless the authorization is terminated or revoked sooner.  Performed at [facility], [address], [place], [place] [address]   I-Stat beta  hCG blood, ED     Status: None   Collection Time: 11/15/19 10:28 PM  Result Value Ref Range   I-stat hCG, quantitative <5.0 <5 mIU/mL   Comment  3            Comment:   GEST. AGE      CONC.  (mIU/mL)   <=1 WEEK        5 - 50     2 WEEKS       50 - 500     3 WEEKS       100 - 10,000     4 WEEKS     1,000 - 30,000        FEMALE AND NON-PREGNANT FEMALE:     LESS THAN 5 mIU/mL     Blood Alcohol level:  Lab Results  Component Value Date   ETH <10 11/15/2019   ETH <10 05/27/2019    Metabolic Disorder Labs:  Lab Results  Component Value Date   HGBA1C 5.0 11/19/2018   MPG 97 11/19/2018   Lab Results  Component Value Date   PROLACTIN 44.1 (H) 11/19/2018   Lab Results  Component Value Date   CHOL 170 11/19/2018   TRIG 50 11/19/2018   HDL 49 11/19/2018   CHOLHDL 3.5 11/19/2018   VLDL 10 11/19/2018   LDLCALC 111 (H) 11/19/2018    Current Medications: Current Facility-Administered Medications  Medication Dose Route Frequency Provider Last Rate Last Admin   acetaminophen (TYLENOL) tablet 650 mg  650 mg Oral Q6H PRN Antonieta Pert, MD       alum & mag hydroxide-simeth (MAALOX/MYLANTA) 200-200-20 MG/5ML suspension 30 mL  30 mL Oral Q4H PRN Antonieta Pert, MD       hydrOXYzine (ATARAX/VISTARIL) tablet 25 mg  25 mg Oral TID PRN Rankin, [name] B, NP       risperiDONE (RISPERDAL M-TABS) disintegrating tablet 2 mg  2 mg Oral Q8H PRN Antonieta Pert, MD       And   LORazepam (ATIVAN) tablet 1 mg  1 mg Oral PRN Antonieta Pert, MD       And   ziprasidone (GEODON) injection 20 mg  20 mg Intramuscular PRN Antonieta Pert, MD       magnesium hydroxide (MILK OF MAGNESIA) suspension 30 mL  30 mL Oral Daily PRN Antonieta Pert, MD       risperiDONE (RISPERDAL M-TABS) disintegrating tablet 1 mg  1 mg Oral Daily Antonieta Pert, MD   1 mg at 11/17/19 9449   risperiDONE (RISPERDAL M-TABS) disintegrating tablet 2 mg  2 mg Oral QHS Antonieta Pert, MD        traZODone (DESYREL) tablet 50 mg  50 mg Oral QHS PRN Antonieta Pert, MD       PTA Medications: Medications Prior to Admission  Medication Sig Dispense Refill Last Dose   FLUoxetine (PROZAC) 20 MG capsule Take 1 capsule (20 mg total) by mouth daily. (Patient not taking: Reported on 11/17/2018) 30 capsule 3    Lumateperone Tosylate (CAPLYTA) 42 MG CAPS Take 42 mg by mouth daily. (Patient not taking: Reported on 01/20/2019) 30 capsule 11    omega-3 acid ethyl esters (LOVAZA) 1 g capsule Take 1 capsule (1 g total) by mouth 2 (two) times daily. (Patient not taking: Reported on 01/20/2019) 60 capsule 5    Prenatal Vit-Fe Fumarate-FA (PRENATAL MULTIVITAMIN) TABS tablet Take 1 tablet by mouth daily at 12 noon. (Patient not taking: Reported on 01/20/2019) 90 tablet 1    risperiDONE microspheres (RISPERDAL CONSTA) 37.5 MG injection Inject 2 mLs (37.5 mg total) into the muscle every 14 (fourteen) days. Due 4/12 1 each  11    temazepam (RESTORIL) 30 MG capsule Take 1 capsule (30 mg total) by mouth at bedtime. (Patient not taking: Reported on 05/27/2019) 30 capsule 0     Musculoskeletal: Strength & Muscle Tone: within normal limits Gait & Station: normal Patient leans: N/A  Psychiatric Specialty Exam: Physical Exam Vitals and nursing note reviewed.  HENT:     Head: Normocephalic and atraumatic.  Pulmonary:     Effort: Pulmonary effort is normal.  Neurological:     General: No focal deficit present.     Mental Status: She is alert and oriented to person, place, and time.     Review of Systems  Blood pressure 133/84, pulse 89, temperature 97.9 F (36.6 C), temperature source Oral, resp. rate 18, height 5\' 8"  (1.727 m), weight 59.4 kg, SpO2 100 %.Body mass index is 19.92 kg/m.  General Appearance: Disheveled  Eye Contact:  Good  Speech:  Normal Rate  Volume:  Increased  Mood:  Dysphoric and Irritable  Affect:  Labile  Thought Process:  Coherent and Descriptions of Associations: Loose   Orientation:  Full (Time, Place, and Person)  Thought Content:  Delusions, Paranoid Ideation and Rumination  Suicidal Thoughts:  No  Homicidal Thoughts:  No  Memory:  Immediate;   Poor Recent;   Poor Remote;   Poor  Judgement:  Impaired  Insight:  Lacking  Psychomotor Activity:  Increased  Concentration:  Concentration: Fair and Attention Span: Fair  Recall:  of Knowledge:  Fair  Language:  Good  Akathisia:  Negative  Handed:  Right  AIMS (if indicated):     Assets:  Desire for Improvement Resilience  ADL's:  Intact  Cognition:  WNL  Sleep:  Number of Hours: 6    Treatment Plan Summary: Daily contact with patient to assess and evaluate symptoms and progress in treatment, Medication management and Plan : Patient is seen and examined.  Patient is a 19 year old female with a probable past psychiatric history significant for schizophrenia who was admitted secondary to paranoia, threatening suicide and violent behavior.  She will be admitted to the hospital.  She will be integrated in the milieu.  She will be encouraged to attend groups.  She did take medications this morning.  I have written for Risperdal 1 mg p.o. daily and 2 mg p.o. nightly.  If necessary we will get a second opinion so we can force medications on her.  During the course of hospitalization we will also give her the long-acting Risperdal injection or perhaps the long-acting paliperidone injection.  We will hold off on the Prozac at least for now.  She will have the agitation protocol in place with Risperdal.  Review of her laboratories revealed essentially normal electrolytes including liver function enzymes.  Her CBC was normal.  Acetaminophen was less than 10, salicylate less than 7.  Beta-hCG was less than 5.  Blood alcohol was less than 10.  Drug screen is not back yet but she has a reported history of marijuana usage.  We will also contact her mother for collateral information and see if she followed up with  anyone after her psychiatric admission.  Observation Level/Precautions:  15 minute checks  Laboratory:  Chemistry Profile  Psychotherapy:    Medications:    Consultations:    Discharge Concerns:    Estimated LOS:  Other:     Physician Treatment Plan for Primary Diagnosis: <principal problem not specified> Long Term Goal(s): Improvement in symptoms so as ready for discharge  Short Term Goals: Ability to identify changes in lifestyle to reduce recurrence of condition will improve, Ability to verbalize feelings will improve, Ability to demonstrate self-control will improve, Ability to identify and develop effective coping behaviors will improve, Ability to maintain clinical measurements within normal limits will improve, Compliance with prescribed medications will improve and Ability to identify triggers associated with substance abuse/mental health issues will improve  Physician Treatment Plan for Secondary Diagnosis: Active Problems:   Psychosis (HCC)   Delusional disorder (HCC)  Long Term Goal(s): Improvement in symptoms so as ready for discharge  Short Term Goals: Ability to identify changes in lifestyle to reduce recurrence of condition will improve, Ability to verbalize feelings will improve, Ability to demonstrate self-control will improve, Ability to identify and develop effective coping behaviors will improve, Ability to maintain clinical measurements within normal limits will improve, Compliance with prescribed medications will improve and Ability to identify triggers associated with substance abuse/mental health issues will improve  I certify that inpatient services furnished can reasonably be expected to improve the patient's condition.    Antonieta Pert, MD 9/9/20213:32 PM

## 2019-11-17 NOTE — Tx Team (Signed)
Initial Treatment Plan 11/17/2019 3:11 AM Angie Lopez Angie Lopez OKH:[id number]    PATIENT STRESSORS: Medication change or noncompliance Occupational concerns   PATIENT STRENGTHS: Average or above average intelligence Supportive family/friends   PATIENT IDENTIFIED PROBLEMS: Psychosis  Medication Non compliance                   DISCHARGE CRITERIA:  Improved stabilization in mood, thinking, and/or behavior Verbal commitment to aftercare and medication compliance  PRELIMINARY DISCHARGE PLAN: Outpatient therapy Return to previous living arrangement  PATIENT/FAMILY INVOLVEMENT: This treatment plan has been presented to and reviewed with the patient, Angie Lopez,.  The patient and family have been given the opportunity to ask questions and make suggestions.  Elbert Ewings, RN 11/17/2019, 3:11 AM

## 2019-11-17 NOTE — Progress Notes (Signed)
Recreation Therapy Notes  Date: 9.9.21 Time: 0945 Location: [address] Dayroom  Group Topic: Wellness  Goal Area(s) Addresses:  Patient will define components of whole wellness. Patient will verbalize benefit of whole wellness.  Intervention: Music   Activity: Exercise.  LRT led patients in a series of stretches to help get them loose.  Patients would take turns leading the group in any dance moves or exercises of their choosing.  Patients were told to drink water and take breaks as needed.  Education: Wellness, Building control surveyor.   Education Outcome: Acknowledges education/In group clarification offered/Needs additional education.   Clinical Observations/Feedback:  Pt did not attend group session.    Caroll Rancher, LRT/CTRS         Lillia Abed, Jermane Brayboy A 11/17/2019 10:59 AM

## 2019-11-17 NOTE — Progress Notes (Signed)
   11/17/19 0901  Vital Signs  Pulse Rate 89  Pulse Rate Source Dinamap  Resp 18  BP 133/84  BP Location Right Arm  BP Method Automatic  Patient Position (if appropriate) Standing   D: Patient denies SI/HI/AVH. Patient denies anxiety and depression. Patient stated that she doesn't belong here and that someone did her "wrong"  Pt. Isolated in her room all day and only went out for meals. A:  Patient took scheduled medicine.  Support and encouragement provided Routine safety checks conducted every 15 minutes. Patient  Informed to notify staff with any concerns.   R: Safety maintained.

## 2019-11-17 NOTE — BHH Counselor (Signed)
Adult Comprehensive Assessment  Patient ID: Angie Lopez, female   DOB: 06/01/2000, 19 y.o.   MRN: [id number]  Information Source: Information source: Patient  Current Stressors: Patient states their primary concerns and needs for treatment are:: "I was IVC'd by someone" Patient states their goals for this hospitilization and ongoing recovery are::"I'm ready to go home." Educational / Learning stressors:Denies, not in school. Employment / Job issues:Denies, not working. Family Relationships:"I don't know my familyPublishing copy / Lack of resources (include bankruptcy):No income, has insurance. Housing / Lack of housing:Denies, reports she lives alone. Physical health (include injuries & life threatening diseases):Denies Social relationships:Yes, from strangers. States everyone is in her business Substance abuse:Denies. Chart review indicates THC use. Bereavement / Loss:Patient denies  Living/Environment/Situation: Living Arrangements:Reports she lives alone in [place] Living conditions (as described by patient or guardian): "I stay by myself. Its fine." Who else lives in the home?:Alone How long has patient lived in current situation?:Unknown What is atmosphere in current home:Comfortable  Family History: Marital status: Single Are you sexually active?: No What is your sexual orientation?: Heterosexual Has your sexual activity been affected by drugs, alcohol, medication, or emotional stress?: pt denies Does patient have children?: No  Childhood History: By whom was/is the patient raised?: Father Additional childhood history information: Mom and dad are separated. They met other people Description of patient's relationship with caregiver when they were a child: "spoiled and he does things for me" Patient's description of current relationship with people who raised him/her:Strained. How were you disciplined when you got in trouble as a child/adolescent?:  "time out and whoppings" Does patient have siblings?: Yes Number of Siblings: 4(2 sisters and 2 brothers) Description of patient's current relationship with siblings:Strained. Did patient suffer any verbal/emotional/physical/sexual abuse as a child?: No Did patient suffer from severe childhood neglect?: No Has patient ever been sexually abused/assaulted/raped as an adolescent or adult?: No ("someone tried to") Was the patient ever a victim of a crime or a disaster?: No Witnessed domestic violence?: No Has patient been effected by domestic violence as an adult?: No  Education: Highest grade of school patient has completed: 12th grade Currently a student?:No Learning disability?: No  Employment/Work Situation: Employment situation: Unemployed Patient's job has been impacted by current illness: No What is the longest time patient has a held a job?: "5-6 months" Where was the patient employed at that time?: "[employer]" Did You Receive Any Psychiatric Treatment/Services While in the [employer]?: No Are There Guns or Other Weapons in [place]?: No  Financial Resources: Museum/gallery curator resources:No income, has insurance Does patient have a Programmer, applications or guardian?: No  Alcohol/Substance Abuse: What has been your use of drugs/alcohol within the last 12 months?: Marijuana If attempted suicide, did drugs/alcohol play a role in this?: No Alcohol/Substance Abuse Treatment Hx: Denies past history Has alcohol/substance abuse ever caused legal problems?: No  Social Support System: Heritage manager System:Poor Describe Community Support System:"Myself" Type of faith/religion: None How does patient's faith help to cope with current illness?: n/a  Leisure/Recreation: Leisure and Hobbies: "I like art, going to park, reading books, researching things I did not know"  Strengths/Needs: What is the patient's perception of their strengths?:  "Independent, able to help myself"  Patient states they can use these personal strengths during their treatment to contribute to their recovery: UTA Patient states these barriers may affect/interfere with their treatment: pt denies Patient states these barriers may affect their return to the community: pt denies  Discharge Plan: Currently receiving community mental  health services: States she see's a primary care doctor and no other provider Patient states concerns and preferences for aftercare planning are:Is interested in learning more about ACTT services  Patient states they will know when they are safe and ready for discharge when:Feels ready now. Does patient have access to transportation?: No Does patient have financial barriers related to discharge medications?: No Will patient be returning to same living situation after discharge?: Yes  Summary/Recommendations:   Summary and Recommendations (to be completed by the evaluator): Patient is a 19 year old female with a previous psychiatric history significant for delusional disorder versus acute psychosis versus most probably schizophrenia who presented to the [facility] emergency department on 11/15/2019 after having been brought in by police.  The patient was arrested for larceny at a [employer].  She refused to leave and was arrested.  It was also reported that later she had been making suicidal statements and was attempting to get the police to shoot her.  She was assessed by the comprehensive clinical assessment note and stated she was being followed by some girls whom wanted to find her.  She had shown them money, and put items back then the girls lied to the officers about the theft.  According to the involuntary commitment per the police she had asked the police to shoot her.  While here, Angie Lopez can benefit from crisis stabilization, medication management, therapeutic milieu, and referrals for  services.

## 2019-11-17 NOTE — Plan of Care (Signed)
Patient is newly admitted and she is currently refusing to take any medications for psychiatric symptoms.

## 2019-11-17 NOTE — B[facility] Suicide Risk Assessment (Signed)
[facility] Admission Suicide Risk Assessment   Nursing information obtained from:    Demographic factors:  Gay, lesbian, or bisexual orientation, Low socioeconomic status, Unemployed Current Mental Status:  NA Loss Factors:  Financial problems / change in socioeconomic status Historical Factors:  NA Risk Reduction Factors:  Sense of responsibility to family  Total Time spent with patient: 30 minutes Principal Problem: <principal problem not specified> Diagnosis:  Active Problems:   Psychosis (HCC)   Delusional disorder (HCC)  Subjective Data: Patient is seen and examined.  Patient is a 19 year old female with a previous psychiatric history significant for delusional disorder versus acute psychosis versus most probably schizophrenia who presented to the [facility] emergency department on 11/15/2019 after having been brought in by police.  The patient was arrested for larceny at a [employer].  She refused to leave and was arrested.  It was also reported that later she had been making suicidal statements and was attempting to get the police to shoot her.  She was assessed by the comprehensive clinical assessment note and stated she was being followed by some girls whom wanted to find her.  She had shown them money, and put items back then the girls lied to the officers about the theft.  According to the involuntary commitment per the police she had asked the police to shoot her.  The patient denied any knowledge of that in the emergency room.  Today she is irritable, refusing to answer questions, and stated "I am not been take any medicine because there is nothing wrong with me".  She has had 2 previous psychiatric hospitalizations at our facility.  Her most recent hospitalization here was from 3/23 to 06/07/2019.  It was felt that she was suffering probably from schizophrenia.  She had been placed on Caplyta as well as receiving a Risperdal Consta injection on 06/04/2019.  It noted  in the discharge summary that she was calmer at discharge.  According to the notes the patient was noncompliant with medications.  It looks like the last time she had had the Risperdal injection was when she was in the hospital last.  On examination today she is paranoid, agitated, irritable.  She was admitted to the hospital for evaluation and stabilization.  Continued Clinical Symptoms:    The "Alcohol Use Disorders Identification Test", Guidelines for Use in Primary Care, Second Edition.  World Science writer [facility]). Score between 0-7:  no or low risk or alcohol related problems. Score between 8-15:  moderate risk of alcohol related problems. Score between 16-19:  high risk of alcohol related problems. Score 20 or above:  warrants further diagnostic evaluation for alcohol dependence and treatment.   CLINICAL FACTORS:   Schizophrenia:   Less than 75 years old Paranoid or undifferentiated type   Musculoskeletal: Strength & Muscle Tone: within normal limits Gait & Station: normal Patient leans: N/A  Psychiatric Specialty Exam: Physical Exam Vitals and nursing note reviewed.  Constitutional:      Appearance: Normal appearance.  HENT:     Head: Normocephalic and atraumatic.  Pulmonary:     Effort: Pulmonary effort is normal.  Neurological:     General: No focal deficit present.     Mental Status: She is alert and oriented to person, place, and time.     Review of Systems  Blood pressure 133/84, pulse 89, temperature 97.9 F (36.6 C), temperature source Oral, resp. rate 18, height 5\' 8"  (1.727 m), weight 59.4 kg, SpO2 100 %.Body mass index  is 19.92 kg/m.  General Appearance: Casual  Eye Contact:  Good  Speech:  Normal Rate  Volume:  Increased  Mood:  Dysphoric and Irritable  Affect:  Labile  Thought Process:  Goal Directed and Descriptions of Associations: Loose  Orientation:  Full (Time, Place, and Person)  Thought Content:  Delusions and Paranoid Ideation  Suicidal  Thoughts:  No  Homicidal Thoughts:  No  Memory:  Immediate;   Poor Recent;   Poor Remote;   Poor  Judgement:  Impaired  Insight:  Lacking  Psychomotor Activity:  Increased  Concentration:  Concentration: Fair and Attention Span: Fair  Recall:  [employer] of Knowledge:  Fair  Language:  Good  Akathisia:  Negative  Handed:  Right  AIMS (if indicated):     Assets:  Desire for Improvement Resilience  ADL's:  Intact  Cognition:  WNL  Sleep:  Number of Hours: 6      COGNITIVE FEATURES THAT CONTRIBUTE TO RISK:  Thought constriction (tunnel vision)    SUICIDE RISK:   Mild:  Suicidal ideation of limited frequency, intensity, duration, and specificity.  There are no identifiable plans, no associated intent, mild dysphoria and related symptoms, good self-control (both objective and subjective assessment), few other risk factors, and identifiable protective factors, including available and accessible social support.  PLAN OF CARE: Patient is seen and examined.  Patient is a 19 year old female with a probable past psychiatric history significant for schizophrenia who was admitted secondary to paranoia, threatening suicide and violent behavior.  She will be admitted to the hospital.  She will be integrated in the milieu.  She will be encouraged to attend groups.  She did take medications this morning.  I have written for Risperdal 1 mg p.o. daily and 2 mg p.o. nightly.  If necessary we will get a second opinion so we can force medications on her.  During the course of hospitalization we will also give her the long-acting Risperdal injection or perhaps the long-acting paliperidone injection.  We will hold off on the Prozac at least for now.  She will have the agitation protocol in place with Risperdal.  Review of her laboratories revealed essentially normal electrolytes including liver function enzymes.  Her CBC was normal.  Acetaminophen was less than 10, salicylate less than 7.  Beta-hCG was less  than 5.  Blood alcohol was less than 10.  Drug screen is not back yet but she has a reported history of marijuana usage.  We will also contact her mother for collateral information and see if she followed up with anyone after her psychiatric admission.  I certify that inpatient services furnished can reasonably be expected to improve the patient's condition.   Antonieta Pert, MD 11/17/2019, 10:25 AM

## 2019-11-17 NOTE — Progress Notes (Signed)
Patient admitted from [facility] under IVC, patient kept saying she should not be here and she is not taking any medicine. She denies any depression, AH/VH, or any suicidal ideation. Per report given to writer patient was charged with larceny recently and her mother petitioned her to be committed due to erratic behaviors and medication non compliance. She refused to sign any paperwork or cmplete the admission process. Checked for contraband and assessed patient's skin during search.

## 2019-11-18 DIAGNOSIS — F3110 Bipolar disorder, current episode manic without psychotic features, unspecified: Secondary | ICD-10-CM

## 2019-11-18 NOTE — Progress Notes (Signed)
[facility]  11/18/2019 1:42 PM Angie RevelsDemetria Lopez  MRN:  [id number]  Subjective: Angie Lopez reports, "I'm doing okay. I'm ready to go home. I was brought to this hospital for no reason, If you are not here to send me home, you get out".  Objective: Patient is a 19 year old female with a previous psychiatric history significant for delusional disorder versus acute psychosis versus most probably schizophrenia who [facility] after having been brought in by police. The patient was arrested for larceny at a [employer] store. She refused to leave and was arrested. It was also reported that later she had been making suicidal statements and was attempting to get the police to shoot her. She was assessed by the comprehensive clinical assessment note and stated she was being followed by some girls whom wanted to find her. She had shown them money, and put items back then the girls lied to the officers about the theft. According to the involuntary commitment per the police she had asked the police to shoot her.   11-18-19; Angie NeedyDemetria is seen, chart reviewed. The chart findings discussed with the treatment team. She presents alert, oriented but agitated today. She is a bit uncooperative today. She is lying down in bed. She is making a fair eye contact. She is currently verbally aggressive. She says she does not want to be here. She [facility]. She is worried that no one knows that she is here at the hospital, then expressed that she really does not have anyone but herself. She threatened that she will not take any medicines if offered. She then asked this provider to get out of her room if she does not have any news about her discharge.  Principal Problem: Bipolar I disorder with mania (HCC)  Diagnosis: Principal Problem:   Bipolar I disorder with mania (HCC) Active Problems:   Delusional  disorder (HCC)   Psychosis (HCC)  Total Time spent with patient: 25 minutes  Past Psychiatric History: ADHD  Past Medical History:  Past Medical History:  Diagnosis Date  . Acne 08/2011   epiduo 2012  . ADHD (attention deficit hyperactivity disorder) 2009   HTN on stimulants, off meds 03/2012  . Allergic conjunctivitis 2012  . Allergic rhinitis 08/2010  . Learning disorder 08/2010   borderline IQ, had IEP in 2012  . Obesity 08/13/2010  . Prediabetes    HBA1C 6.0 03/2010, never took metromin in 2012-2014  . SCFE (slipped capital femoral epiphysis) 2011    Past Surgical History:  Procedure Laterality Date  . HIP PINNING  12/2009   Family History:  Family History  Problem Relation Age of Onset  . Obesity Sister   . Obesity Brother   . Hypertension Maternal Grandmother   . Diabetes Maternal Grandmother   . Obesity Maternal Grandmother   . Hypertension Maternal Grandfather   . Obesity Mother   . Diabetes Maternal Aunt   . Obesity Maternal Aunt   . Diabetes Cousin   . Thyroid disease Neg Hx    Family Psychiatric  History: See H&P  Social History:  Social History   Substance and Sexual Activity  Alcohol Use Never  . Alcohol/week: 0.0 standard drinks     Social History   Substance and Sexual Activity  Drug Use Not Currently  . Types: Marijuana, Cocaine   Comment: pt denies current drug use    Social History  Socioeconomic History  . Marital status: Single    Spouse name: Not on file  . Number of children: Not on file  . Years of education: Not on file  . Highest education level: Not on file  Occupational History  . Not on file  Tobacco Use  . Smoking status: Former Smoker    Types: Cigarettes  . Smokeless tobacco: Never Used  Vaping Use  . Vaping Use: Never used  Substance and Sexual Activity  . Alcohol use: Never    Alcohol/week: 0.0 standard drinks  . Drug use: Not Currently    Types: Marijuana, Cocaine    Comment: pt denies current drug use  .  Sexual activity: Not Currently    Birth control/protection: Implant    Comment: "taken out about a year ago. Don't need it now"  Other Topics Concern  . Not on file  Social History Narrative  . Not on file   Social Determinants of Health   Financial Resource Strain:   . Difficulty of Paying Living Expenses: Not on file  Food Insecurity:   . Worried About Programme researcher, broadcasting/film/video in the Last Year: Not on file  . Ran Out of Food in the Last Year: Not on file  Transportation Needs:   . Lack of Transportation (Medical): Not on file  . Lack of Transportation (Non-Medical): Not on file  Physical Activity:   . Days of Exercise per Week: Not on file  . Minutes of Exercise per Session: Not on file  Stress:   . Feeling of Stress : Not on file  Social Connections:   . Frequency of Communication with Friends and Family: Not on file  . Frequency of Social Gatherings with Friends and Family: Not on file  . Attends Religious Services: Not on file  . Active Member of Clubs or Organizations: Not on file  . Attends Banker Meetings: Not on file  . Marital Status: Not on file   Additional Social History:   Sleep: Good  Appetite:  Good  Current Medications: Current Facility-Administered Medications  Medication Dose Route Frequency Provider Last Rate Last Admin  . acetaminophen (TYLENOL) tablet 650 mg  650 mg Oral Q6H PRN Antonieta Pert, MD      . alum & mag hydroxide-simeth (MAALOX/MYLANTA) 200-200-20 MG/5ML suspension 30 mL  30 mL Oral Q4H PRN Antonieta Pert, MD      . hydrOXYzine (ATARAX/VISTARIL) tablet 25 mg  25 mg Oral TID PRN Rankin, [name] B, NP   25 mg at 11/17/19 2117  . risperiDONE (RISPERDAL M-TABS) disintegrating tablet 2 mg  2 mg Oral Q8H PRN Antonieta Pert, MD       And  . LORazepam (ATIVAN) tablet 1 mg  1 mg Oral PRN Antonieta Pert, MD       And  . ziprasidone (GEODON) injection 20 mg  20 mg Intramuscular PRN Antonieta Pert, MD      . magnesium  hydroxide (MILK OF MAGNESIA) suspension 30 mL  30 mL Oral Daily PRN Antonieta Pert, MD      . risperiDONE (RISPERDAL M-TABS) disintegrating tablet 1 mg  1 mg Oral Daily Antonieta Pert, MD   1 mg at 11/18/19 0805  . risperiDONE (RISPERDAL M-TABS) disintegrating tablet 2 mg  2 mg Oral QHS Antonieta Pert, MD   2 mg at 11/17/19 2117  . traZODone (DESYREL) tablet 50 mg  50 mg Oral QHS PRN Antonieta Pert, MD   50  mg at 11/17/19 2118   Lab Results:  No results found for this or any previous visit (from the past 48 hour(s)). Blood Alcohol level:  Lab Results  Component Value Date   ETH <10 11/15/2019   ETH <10 05/27/2019   Metabolic Disorder Labs: Lab Results  Component Value Date   HGBA1C 5.0 11/19/2018   MPG 97 11/19/2018   Lab Results  Component Value Date   PROLACTIN 44.1 (H) 11/19/2018   Lab Results  Component Value Date   CHOL 170 11/19/2018   TRIG 50 11/19/2018   HDL 49 11/19/2018   CHOLHDL 3.5 11/19/2018   VLDL 10 11/19/2018   LDLCALC 111 (H) 11/19/2018   Physical Findings: AIMS:  , ,  ,  ,    CIWA:    COWS:     Musculoskeletal: Strength & Muscle Tone: within normal limits Gait & Station: normal Patient leans: N/A  Psychiatric Specialty Exam: Physical Exam Vitals and nursing note reviewed.  Constitutional:      Appearance: She is well-developed.  HENT:     Nose: Nose normal.     Mouth/Throat:     Pharynx: Oropharynx is clear.  Eyes:     Pupils: Pupils are equal, round, and reactive to light.  Cardiovascular:     Rate and Rhythm: Normal rate.  Pulmonary:     Effort: Pulmonary effort is normal.  Genitourinary:    Comments: Deferred Musculoskeletal:        General: Normal range of motion.     Cervical back: Normal range of motion.  Skin:    General: Skin is warm.  Neurological:     Mental Status: She is alert and oriented to person, place, and time.     Review of Systems  Constitutional: Negative for chills and fever.  Respiratory:  Negative for cough, shortness of breath and wheezing.   Cardiovascular: Negative for chest pain and palpitations.  Gastrointestinal: Negative for nausea and vomiting.  Neurological: Negative for dizziness and headaches.  Psychiatric/Behavioral: Positive for depression, hallucinations and substance abuse. Negative for memory loss and suicidal ideas. The patient is nervous/anxious. The patient does not have insomnia.     Blood pressure 123/68, pulse 85, temperature 97.9 F (36.6 C), temperature source Oral, resp. rate 18, height 5\' 8"  (1.727 m), weight 59.4 kg, SpO2 100 %.Body mass index is 19.92 kg/m.  General Appearance: Disheveled  Eye Contact:  Good  Speech:  Normal Rate  Volume:  Increased  Mood:  Dysphoric and Irritable, uncooperative  Affect:  Labile  Thought Process:  Coherent and Descriptions of Associations: Loose  Orientation:  Full (Time, Place, and Person)  Thought Content:  Delusions, Paranoid Ideation and Rumination  Suicidal Thoughts:  No  Homicidal Thoughts:  No  Memory:  Immediate;   Poor Recent;   Poor Remote;   Poor  Judgement:  Impaired  Insight:  Lacking  Psychomotor Activity:  Increased  Concentration:  Concentration: Fair and Attention Span: Fair  Recall:  of Knowledge:  Fair  Language:  Good  Akathisia:  Negative  Handed:  Right  AIMS (if indicated):     Assets:  Desire for Improvement Resilience  ADL's:  Intact  Cognition:  WNL    Sleep: 6.75     Treatment Plan Summary: Daily contact with patient to assess and evaluate symptoms and progress in treatment and Medication management.  Continue inpatient hospitalization.  Will continue today 11/18/2019 plan as below except where it is noted.  Mood control.  Continue Risperdal-M-tabs 1 mg po daily.  Continue Risperdal M-tabs 2 mg po Q bedtime.  Anxiety.  Continue Vistaril 25 mg po tid prn.      Agitation/psychosis   Continue Risperdal M-Tabs 2 mg po Q 8 hrs prn.  &  Lorazepam 1 mg  po prn x 1 dose.  &  Geodon 20 mg IM x 1 dose.  Insomnia.  Continue Risperdal 50 mg po Q hs prn    Encourage participation in groups and therapeutic milieu Disposition planning will be ongoing  Armandina Stammer, NP, PMHNP, FNP-BC 11/18/2019, 1:42 PMPatient ID: Angie Lopez, female   DOB: 11-14-2000, 19 y.o.   MRN: [id number]

## 2019-11-18 NOTE — Progress Notes (Signed)
Pt did not attend wrap-up group   

## 2019-11-18 NOTE — Tx Team (Signed)
Interdisciplinary Treatment and Diagnostic Plan Update  11/18/2019 Time of Session: 10:30AM Angie Lopez MRN: [id number]  Principal Diagnosis: <principal problem not specified>  Secondary Diagnoses: Active Problems:   Psychosis (HCC)   Delusional disorder (HCC)   Current Medications:  Current Facility-Administered Medications  Medication Dose Route Frequency Provider Last Rate Last Admin  . acetaminophen (TYLENOL) tablet 650 mg  650 mg Oral Q6H PRN Angie Pert, MD      . alum & mag hydroxide-simeth (MAALOX/MYLANTA) 200-200-20 MG/5ML suspension 30 mL  30 mL Oral Q4H PRN Angie Pert, MD      . hydrOXYzine (ATARAX/VISTARIL) tablet 25 mg  25 mg Oral TID PRN Lopez, Angie B, NP   25 mg at 11/17/19 2117  . risperiDONE (RISPERDAL M-TABS) disintegrating tablet 2 mg  2 mg Oral Q8H PRN Angie Pert, MD       And  . LORazepam (ATIVAN) tablet 1 mg  1 mg Oral PRN Angie Pert, MD       And  . ziprasidone (GEODON) injection 20 mg  20 mg Intramuscular PRN Angie Pert, MD      . magnesium hydroxide (MILK OF MAGNESIA) suspension 30 mL  30 mL Oral Daily PRN Angie Pert, MD      . risperiDONE (RISPERDAL M-TABS) disintegrating tablet 1 mg  1 mg Oral Daily Angie Pert, MD   1 mg at 11/18/19 0805  . risperiDONE (RISPERDAL M-TABS) disintegrating tablet 2 mg  2 mg Oral QHS Angie Pert, MD   2 mg at 11/17/19 2117  . traZODone (DESYREL) tablet 50 mg  50 mg Oral QHS PRN Angie Pert, MD   50 mg at 11/17/19 2118   PTA Medications: Medications Prior to Admission  Medication Sig Dispense Refill Last Dose  . FLUoxetine (PROZAC) 20 MG capsule Take 1 capsule (20 mg total) by mouth daily. (Patient not taking: Reported on 11/17/2018) 30 capsule 3   . Lumateperone Tosylate (CAPLYTA) 42 MG CAPS Take 42 mg by mouth daily. (Patient not taking: Reported on 01/20/2019) 30 capsule 11   . omega-3 acid ethyl esters (LOVAZA) 1 g capsule Take 1 capsule (1 g total) by  mouth 2 (two) times daily. (Patient not taking: Reported on 01/20/2019) 60 capsule 5   . Prenatal Vit-Fe Fumarate-FA (PRENATAL MULTIVITAMIN) TABS tablet Take 1 tablet by mouth daily at 12 noon. (Patient not taking: Reported on 01/20/2019) 90 tablet 1   . risperiDONE microspheres (RISPERDAL CONSTA) 37.5 MG injection Inject 2 mLs (37.5 mg total) into the muscle every 14 (fourteen) days. Due 4/12 1 each 11   . temazepam (RESTORIL) 30 MG capsule Take 1 capsule (30 mg total) by mouth at bedtime. (Patient not taking: Reported on 05/27/2019) 30 capsule 0     Patient Stressors: Medication change or noncompliance Occupational concerns  Patient Strengths: Average or above average intelligence Supportive family/friends  Treatment Modalities: Medication Management, Group therapy, Case management,  1 to 1 session with clinician, Psychoeducation, Recreational therapy.   Physician Treatment Plan for Primary Diagnosis: <principal problem not specified> Long Term Goal(s): Improvement in symptoms so as ready for discharge Improvement in symptoms so as ready for discharge   Short Term Goals: Ability to identify changes in lifestyle to reduce recurrence of condition will improve Ability to verbalize feelings will improve Ability to demonstrate self-control will improve Ability to identify and develop effective coping behaviors will improve Ability to maintain clinical measurements within normal limits will improve Compliance with prescribed medications will  improve Ability to identify triggers associated with substance abuse/mental health issues will improve Ability to identify changes in lifestyle to reduce recurrence of condition will improve Ability to verbalize feelings will improve Ability to demonstrate self-control will improve Ability to identify and develop effective coping behaviors will improve Ability to maintain clinical measurements within normal limits will improve Compliance with  prescribed medications will improve Ability to identify triggers associated with substance abuse/mental health issues will improve  Medication Management: Evaluate patient's response, side effects, and tolerance of medication regimen.  Therapeutic Interventions: 1 to 1 sessions, Unit Group sessions and Medication administration.  Evaluation of Outcomes: Not Met  Physician Treatment Plan for Secondary Diagnosis: Active Problems:   Psychosis ([place])   Delusional disorder ([place])  Long Term Goal(s): Improvement in symptoms so as ready for discharge Improvement in symptoms so as ready for discharge   Short Term Goals: Ability to identify changes in lifestyle to reduce recurrence of condition will improve Ability to verbalize feelings will improve Ability to demonstrate self-control will improve Ability to identify and develop effective coping behaviors will improve Ability to maintain clinical measurements within normal limits will improve Compliance with prescribed medications will improve Ability to identify triggers associated with substance abuse/mental health issues will improve Ability to identify changes in lifestyle to reduce recurrence of condition will improve Ability to verbalize feelings will improve Ability to demonstrate self-control will improve Ability to identify and develop effective coping behaviors will improve Ability to maintain clinical measurements within normal limits will improve Compliance with prescribed medications will improve Ability to identify triggers associated with substance abuse/mental health issues will improve     Medication Management: Evaluate patient's response, side effects, and tolerance of medication regimen.  Therapeutic Interventions: 1 to 1 sessions, Unit Group sessions and Medication administration.  Evaluation of Outcomes: Not Met   RN Treatment Plan for Primary Diagnosis: <principal problem not specified> Long Term Goal(s): Knowledge  of disease and therapeutic regimen to maintain health will improve  Short Term Goals: Ability to verbalize frustration and anger appropriately will improve, Ability to demonstrate self-control, Ability to participate in decision making will improve, Ability to verbalize feelings will improve, Ability to identify and develop effective coping behaviors will improve and Compliance with prescribed medications will improve  Medication Management: RN will administer medications as ordered by provider, will assess and evaluate patient's response and provide education to patient for prescribed medication. RN will report any adverse and/or side effects to prescribing provider.  Therapeutic Interventions: 1 on 1 counseling sessions, Psychoeducation, Medication administration, Evaluate responses to treatment, Monitor vital signs and CBGs as ordered, Perform/monitor CIWA, COWS, AIMS and Fall Risk screenings as ordered, Perform wound care treatments as ordered.  Evaluation of Outcomes: Not Met   LCSW Treatment Plan for Primary Diagnosis: <principal problem not specified> Long Term Goal(s): Safe transition to appropriate next level of care at discharge, Engage patient in therapeutic group addressing interpersonal concerns.  Short Term Goals: Engage patient in aftercare planning with referrals and resources, Increase social support, Increase emotional regulation, Identify triggers associated with mental health/substance abuse issues and Increase skills for wellness and recovery  Therapeutic Interventions: Assess for all discharge needs, 1 to 1 time with Social worker, Explore available resources and support systems, Assess for adequacy in community support network, Educate family and significant other(s) on suicide prevention, Complete Psychosocial Assessment, Interpersonal group therapy.  Evaluation of Outcomes: Not Met   Progress in Treatment: Attending groups: No. Participating in groups: No. Taking  medication as prescribed:  Yes. Toleration medication: Yes. Family/Significant other contact made: No, will contact:  declined consents Patient understands diagnosis: No. Discussing patient identified problems/goals with staff: Yes. Medical problems stabilized or resolved: Yes. Denies suicidal/homicidal ideation: Yes. Issues/concerns per patient self-inventory: No.   New problem(s) identified: No, Describe:  none  New Short Term/Long Term Goal(s): medication stabilization, elimination of SI thoughts, development of comprehensive mental wellness plan.   Patient Goals:  "To go home today"  Discharge Plan or Barriers: Patient recently admitted. CSW will continue to follow and assess for appropriate referrals and possible discharge planning.   Reason for Continuation of Hospitalization: Aggression Delusions  Medication stabilization  Estimated Length of Stay: 3-5 days  Attendees: Patient: Angie Lopez 11/18/2019   Physician: Melba Coon, MD 11/18/2019   Nursing:  11/18/2019   RN Care Manager: 11/18/2019   Social Worker: Darletta Moll, LCSW 11/18/2019   Recreational Therapist:  11/18/2019   Other: Dr. Demaris Callander 11/18/2019  Other: Dr. Rosita Kea 11/18/2019   Other: Cammy Brochure, LCSW 11/18/2019     Scribe for Treatment Team: Vassie Moselle, LCSW 11/18/2019 10:54 AM

## 2019-11-18 NOTE — Progress Notes (Signed)
Cooperative with treatment. She spent most of the evening in the room resting in bed. She denies any depression, AH/VH, or any suicidal ideation.  She was compliant with medications on shift. She is currently in bed resting quietly.

## 2019-11-19 DIAGNOSIS — F1210 Cannabis abuse, uncomplicated: Secondary | ICD-10-CM | POA: Diagnosis present

## 2019-11-19 NOTE — [facility] Group Notes (Signed)
.  Psychoeducational Group Note    Date: 11-19-19 Time: 0900    Goal Setting and Orientation Group  Purpose of Group: To be able to set a goal that is measurable and that can be accomplished in one day. Pt will also be oriented to the unit rules and to the schedule of the day.  Participation Level:  Did Not Attend   Angie Lopez A  

## 2019-11-19 NOTE — [facility] Group Notes (Signed)
LCSW Group Therapy Note  11/19/2019   10:00-11:00am   Type of Therapy and Topic:  Group Therapy: Anger Cues and Responses  Participation Level:  Did Not Attend   Description of Group:   In this group, patients learned how to recognize the physical, cognitive, emotional, and behavioral responses they have to anger-provoking situations.  They identified a recent time they became angry and how they reacted.  They analyzed how their reaction was possibly beneficial and how it was possibly unhelpful.  The group discussed a variety of healthier coping skills that could help with such a situation in the future.  Focus was placed on how helpful it is to recognize the underlying emotions to our anger, because working on those can lead to a more permanent solution as well as our ability to focus on the important rather than the urgent.  Therapeutic Goals: 1. Patients will remember their last incident of anger and how they felt emotionally and physically, what their thoughts were at the time, and how they behaved. 2. Patients will identify how their behavior at that time worked for them, as well as how it worked against them. 3. Patients will explore possible new behaviors to use in future anger situations. 4. Patients will learn that anger itself is normal and cannot be eliminated, and that healthier reactions can assist with resolving conflict rather than worsening situations.  Summary of Patient Progress:  The patient did not attend.   Therapeutic Modalities:   Cognitive Behavioral Therapy  Sueann Brownley D Ethridge Sollenberger    

## 2019-11-19 NOTE — Progress Notes (Signed)
Patient's mother called:   [phone]

## 2019-11-19 NOTE — Progress Notes (Signed)
[facility] MD Progress Note  11/19/2019 2:08 PM Angie Lopez  MRN:  [id number]  Subjective: Angie Lopez reports, " I'm ready to go home. I have my medicines at home. I can take my medicines when I get home. The reason I'm in this hospital is because I like to fight & I fight to win. I'm a grown ass-woman. I like to take chances, you get what I'm saying? You should take chances too. The reason you you are still where you are is because you are too afraid to take chances, you get what I'm saying?  Objective: Patient is a 19 year old female with a previous psychiatric history significant for delusional disorder versus acute psychosis versus most probably schizophrenia who presented to the [facility] emergency department on 11/15/2019 after having been brought in by police. The patient was arrested for larceny at a [employer]. She refused to leave and was arrested. It was also reported that later she had been making suicidal statements and was attempting to get the police to shoot her. She was assessed by the comprehensive clinical assessment note and stated she was being followed by some girls whom wanted to find her. She had shown them money, and put items back then the girls lied to the officers about the theft. According to the involuntary commitment per the police she had asked the police to shoot her.   11-19-19; Angie Lopez is seen, chart reviewed. The chart findings discussed with the treatment team. She presents alert, oriented, still agitated & verbally aggressive. See the above subjective report. She is a bit uncooperative today. She is lying down in bed. She is making a fair eye contact. She is currently verbally aggressive. She says she does not want to be here. She thinks she was brought to the [facility] for no apparent reason. She is worried that no one knows that she is here at the hospital, then expressed that she really does not have anyone but herself. She threatened yesterday  that she will not take any medicines if offered. However, the nurse working with patient says today that Angie Lopez is taking & tolerating her medication. She says she doe not want to come of her room because everyone here is sick & she does not want to get sick".  Principal Problem: Bipolar I disorder with mania (HCC)  Diagnosis: Principal Problem:   Bipolar I disorder with mania (HCC) Active Problems:   Delusional disorder (HCC)   Psychosis (HCC)  Total Time spent with patient: 25 minutes  Past Psychiatric History: ADHD  Past Medical History:  Past Medical History:  Diagnosis Date  . Acne 08/2011   epiduo 2012  . ADHD (attention deficit hyperactivity disorder) 2009   HTN on stimulants, off meds 03/2012  . Allergic conjunctivitis 2012  . Allergic rhinitis 08/2010  . Learning disorder 08/2010   borderline IQ, had IEP in 2012  . Obesity 08/13/2010  . Prediabetes    HBA1C 6.0 03/2010, never took metromin in 2012-2014  . SCFE (slipped capital femoral epiphysis) 2011    Past Surgical History:  Procedure Laterality Date  . HIP PINNING  12/2009   Family History:  Family History  Problem Relation Age of Onset  . Obesity Sister   . Obesity Brother   . Hypertension Maternal Grandmother   . Diabetes Maternal Grandmother   . Obesity Maternal Grandmother   . Hypertension Maternal Grandfather   . Obesity Mother   . Diabetes Maternal Aunt   . Obesity Maternal Aunt   .  Diabetes Cousin   . Thyroid disease Neg Hx    Family Psychiatric  History: See H&P  Social History:  Social History   Substance and Sexual Activity  Alcohol Use Never  . Alcohol/week: 0.0 standard drinks     Social History   Substance and Sexual Activity  Drug Use Not Currently  . Types: Marijuana, Cocaine   Comment: pt denies current drug use    Social History   Socioeconomic History  . Marital status: Single    Spouse name: Not on file  . Number of children: Not on file  . Years of education: Not on  file  . Highest education level: Not on file  Occupational History  . Not on file  Tobacco Use  . Smoking status: Former Smoker    Types: Cigarettes  . Smokeless tobacco: Never Used  Vaping Use  . Vaping Use: Never used  Substance and Sexual Activity  . Alcohol use: Never    Alcohol/week: 0.0 standard drinks  . Drug use: Not Currently    Types: Marijuana, Cocaine    Comment: pt denies current drug use  . Sexual activity: Not Currently    Birth control/protection: Implant    Comment: "taken out about a year ago. Don't need it now"  Other Topics Concern  . Not on file  Social History Narrative  . Not on file   Social Determinants of Health   Financial Resource Strain:   . Difficulty of Paying Living Expenses: Not on file  Food Insecurity:   . Worried About Programme researcher, broadcasting/film/videounning Out of Food in the Last Year: Not on file  . Ran Out of Food in the Last Year: Not on file  Transportation Needs:   . Lack of Transportation (Medical): Not on file  . Lack of Transportation (Non-Medical): Not on file  Physical Activity:   . Days of Exercise per Week: Not on file  . Minutes of Exercise per Session: Not on file  Stress:   . Feeling of Stress : Not on file  Social Connections:   . Frequency of Communication with Friends and Family: Not on file  . Frequency of Social Gatherings with Friends and Family: Not on file  . Attends Religious Services: Not on file  . Active Member of Clubs or Organizations: Not on file  . Attends BankerClub or Organization Meetings: Not on file  . Marital Status: Not on file   Additional Social History:   Sleep: Good  Appetite:  Good  Current Medications: Current Facility-Administered Medications  Medication Dose Route Frequency Provider Last Rate Last Admin  . acetaminophen (TYLENOL) tablet 650 mg  650 mg Oral Q6H PRN Antonieta Pertlary, Greg Lawson, MD      . alum & mag hydroxide-simeth (MAALOX/MYLANTA) 200-200-20 MG/5ML suspension 30 mL  30 mL Oral Q4H PRN Antonieta Pertlary, Greg Lawson, MD       . hydrOXYzine (ATARAX/VISTARIL) tablet 25 mg  25 mg Oral TID PRN Rankin, [name] B, NP   25 mg at 11/17/19 2117  . risperiDONE (RISPERDAL M-TABS) disintegrating tablet 2 mg  2 mg Oral Q8H PRN Antonieta Pertlary, Greg Lawson, MD       And  . LORazepam (ATIVAN) tablet 1 mg  1 mg Oral PRN Antonieta Pertlary, Greg Lawson, MD       And  . ziprasidone (GEODON) injection 20 mg  20 mg Intramuscular PRN Antonieta Pertlary, Greg Lawson, MD      . magnesium hydroxide (MILK OF MAGNESIA) suspension 30 mL  30 mL Oral Daily PRN Clary,  Marlane Mingle, MD      . risperiDONE (RISPERDAL M-TABS) disintegrating tablet 1 mg  1 mg Oral Daily Antonieta Pert, MD   1 mg at 11/19/19 0936  . risperiDONE (RISPERDAL M-TABS) disintegrating tablet 2 mg  2 mg Oral QHS Antonieta Pert, MD   2 mg at 11/17/19 2117  . traZODone (DESYREL) tablet 50 mg  50 mg Oral QHS PRN Antonieta Pert, MD   50 mg at 11/17/19 2118   Lab Results:  No results found for this or any previous visit (from the past 48 hour(s)). Blood Alcohol level:  Lab Results  Component Value Date   ETH <10 11/15/2019   ETH <10 05/27/2019   Metabolic Disorder Labs: Lab Results  Component Value Date   HGBA1C 5.0 11/19/2018   MPG 97 11/19/2018   Lab Results  Component Value Date   PROLACTIN 44.1 (H) 11/19/2018   Lab Results  Component Value Date   CHOL 170 11/19/2018   TRIG 50 11/19/2018   HDL 49 11/19/2018   CHOLHDL 3.5 11/19/2018   VLDL 10 11/19/2018   LDLCALC 111 (H) 11/19/2018   Physical Findings: AIMS:  , ,  ,  ,    CIWA:    COWS:     Musculoskeletal: Strength & Muscle Tone: within normal limits Gait & Station: normal Patient leans: N/A  Psychiatric Specialty Exam: Physical Exam Vitals and nursing note reviewed.  Constitutional:      Appearance: She is well-developed.  HENT:     Nose: Nose normal.     Mouth/Throat:     Pharynx: Oropharynx is clear.  Eyes:     Pupils: Pupils are equal, round, and reactive to light.  Cardiovascular:     Rate and Rhythm:  Normal rate.  Pulmonary:     Effort: Pulmonary effort is normal.  Genitourinary:    Comments: Deferred Musculoskeletal:        General: Normal range of motion.     Cervical back: Normal range of motion.  Skin:    General: Skin is warm.  Neurological:     Mental Status: She is alert and oriented to person, place, and time.     Review of Systems  Constitutional: Negative for chills and fever.  Respiratory: Negative for cough, shortness of breath and wheezing.   Cardiovascular: Negative for chest pain and palpitations.  Gastrointestinal: Negative for nausea and vomiting.  Neurological: Negative for dizziness and headaches.  Psychiatric/Behavioral: Positive for depression, hallucinations and substance abuse. Negative for memory loss and suicidal ideas. The patient is nervous/anxious. The patient does not have insomnia.     Blood pressure 123/68, pulse 85, temperature 97.9 F (36.6 C), temperature source Oral, resp. rate 18, height 5\' 8"  (1.727 m), weight 59.4 kg, SpO2 100 %.Body mass index is 19.92 kg/m.  General Appearance: Disheveled  Eye Contact:  Good  Speech:  Normal Rate  Volume:  Increased  Mood:  Dysphoric and Irritable, uncooperative  Affect:  Labile  Thought Process:  Coherent and Descriptions of Associations: Loose  Orientation:  Full (Time, Place, and Person)  Thought Content:  Delusions, Paranoid Ideation and Rumination  Suicidal Thoughts:  No  Homicidal Thoughts:  No  Memory:  Immediate;   Poor Recent;   Poor Remote;   Poor  Judgement:  Impaired  Insight:  Lacking  Psychomotor Activity:  Increased  Concentration:  Concentration: Fair and Attention Span: Fair  Recall:  of Knowledge:  Fair  Language:  Good  Akathisia:  Negative  Handed:  Right  AIMS (if indicated):     Assets:  Desire for Improvement Resilience  ADL's:  Intact  Cognition:  WNL    Sleep: 6.75     Treatment Plan Summary: Daily contact with patient to assess and evaluate  symptoms and progress in treatment and Medication management.  Continue inpatient hospitalization.  Will continue today 11/19/2019 plan as below except where it is noted.  Mood control.  Continue Risperdal-M-tabs 1 mg po daily.  Continue Risperdal M-tabs 2 mg po Q bedtime.  Anxiety.  Continue Vistaril 25 mg po tid prn.      Agitation/psychosis   Continue Risperdal M-Tabs 2 mg po Q 8 hrs prn.  &  Lorazepam 1 mg po prn x 1 dose.  &  Geodon 20 mg IM x 1 dose.  Insomnia.  Continue Risperdal 50 mg po Q hs prn    Encourage participation in groups and therapeutic milieu Disposition planning will be ongoing  Armandina Stammer, NP, PMHNP, FNP-BC 11/19/2019, 2:08 PMPatient ID: Angie Lopez, female   DOB: Nov 20, 2000, 19 y.o.   MRN: [id number] Patient ID: Angie Lopez, female   DOB: 10/19/2000, 19 y.o.   MRN: [id number]

## 2019-11-19 NOTE — Progress Notes (Signed)
   11/19/19 2209  COVID-19 Daily Checkoff  Have you had a fever (temp > 37.80C/100F)  in the past 24 hours?  No  If you have had runny nose, nasal congestion, sneezing in the past 24 hours, has it worsened? No  COVID-19 EXPOSURE  Have you traveled outside the state in the past 14 days? No  Have you been in contact with someone with a confirmed diagnosis of COVID-19 or PUI in the past 14 days without wearing appropriate PPE? No  Have you been living in the same home as a person with confirmed diagnosis of COVID-19 or a PUI (household contact)? No  Have you been diagnosed with COVID-19? No

## 2019-11-19 NOTE — Progress Notes (Signed)
   11/19/19 2211  Psych Admission Type (Psych Patients Only)  Admission Status Involuntary  Psychosocial Assessment  Patient Complaints None  Eye Contact Brief  Facial Expression Flat  Affect Blunted  Speech Logical/coherent  Interaction Assertive;Guarded  Motor Activity Other (Comment) (WDL)  Appearance/Hygiene Unremarkable  Behavior Characteristics Guarded  Mood Labile  Thought Process  Coherency Circumstantial  Content Paranoia;Blaming others  Delusions None reported or observed  Perception WDL  Hallucination None reported or observed  Judgment Poor  Confusion None  Danger to Self  Current suicidal ideation? Denies  Danger to Others  Danger to Others None reported or observed

## 2019-11-19 NOTE — Progress Notes (Signed)
   11/19/19 0655  Psych Admission Type (Psych Patients Only)  Admission Status Involuntary  Psychosocial Assessment  Patient Complaints Agitation  Eye Contact Glaring  Facial Expression Animated  Affect Irritable  Speech Rapid;Loud  Interaction Guarded;Assertive;Defensive  Motor Activity Fidgety  Appearance/Hygiene Unremarkable  Behavior Characteristics Guarded;Irritable  Mood Preoccupied;Suspicious  Thought Process  Coherency Circumstantial;Tangential  Content Blaming others;Paranoia;Preoccupation  Delusions None reported or observed  Perception WDL  Hallucination None reported or observed  Judgment Poor  Confusion None  Danger to Self  Current suicidal ideation? Denies  Danger to Others  Danger to Others None reported or observed   Pt asleep the entire shift. Pt up this morning asking about the doctor and when she can go home and why she has to take meds. Pt argumentative about if the providers here are "real doctors." Pt apparently thought the nurses were doctors. "Y'all confused about who you are. Why am I here? My doctor knows I don't take medicine. I'm here because of someone else's feelings." Pt abruptly stopped talking and went back to her room. Pt maintained safely on the unit.

## 2019-11-19 NOTE — Progress Notes (Signed)
Adult Psychoeducational Group Note  Date:  11/19/2019 Time:  5:21 AM  Group Topic/Focus:  Wrap-Up Group:   The focus of this group is to help patients review their daily goal of treatment and discuss progress on daily workbooks.  Participation Level:  Did Not Attend  Participation Quality:  Did Not Attend  Affect:  Did Not Attend  Cognitive:  Did Not Attend  Insight: None  Engagement in Group:  Did Not Attend  Modes of Intervention:  Did Not Attend  Additional Comments:  Pt did not attend evening wrap up group tonight.  Felipa Furnace 11/19/2019, 5:21 AM

## 2019-11-19 NOTE — Progress Notes (Signed)
D. Pt continues to isolate to her room- remaining in bed for the majority of the shift. Pt continues to be agitated and verbally aggressive at times during interactions.  A. Labs and vitals monitored.Pt supported emotionally and encouraged to express concerns and ask questions.   R. Pt remains safe with 15 minute checks. Will continue POC.

## 2019-11-20 DIAGNOSIS — F1210 Cannabis abuse, uncomplicated: Secondary | ICD-10-CM

## 2019-11-20 DIAGNOSIS — F19959 Other psychoactive substance use, unspecified with psychoactive substance-induced psychotic disorder, unspecified: Secondary | ICD-10-CM

## 2019-11-20 DIAGNOSIS — F22 Delusional disorders: Secondary | ICD-10-CM

## 2019-11-20 DIAGNOSIS — R4689 Other symptoms and signs involving appearance and behavior: Secondary | ICD-10-CM

## 2019-11-20 NOTE — Progress Notes (Signed)
[facility] MD Progress Note  11/20/2019 3:54 PM Angie Lopez  MRN:  [id number]  Subjective: Angie Lopez reports, " I'm doing good. Can I just talk to to the doctor about discharge. I need to go home".  Objective: Patient is a 19 year old female with a previous psychiatric history significant for delusional disorder versus acute psychosis versus most probably schizophrenia who presented to the [facility] emergency department on 11/15/2019 after having been brought in by police. The patient was arrested for larceny at a [employer]. She refused to leave and was arrested. It was also reported that later she had been making suicidal statements and was attempting to get the police to shoot her. She was assessed by the comprehensive clinical assessment note and stated she was being followed by some girls whom wanted to find her. She had shown them money, and put items back then the girls lied to the officers about the theft. According to the involuntary commitment per the police she had asked the police to shoot her.  11-20-19; Angie Lopez is seen, chart reviewed. The chart findings discussed with the treatment team. She presents alert, oriented, still agitated & verbally aggressive with some improved mood noted today during this follow-up evaluation. See the above subjective report. She is a bit cooperative today. She is lying down in bed. She is making a good eye contact. She is currently civil in her mannerism today. She says she wants to be discharged. She thinks she was brought to the [facility] for no apparent reason. She is worried that no one knows that she is here at the hospital, then expressed that she really does not have anyone but herself.  She said yesterday that she did not want to come of her room because everyone here is sick & she does not want to get sick.  Principal Problem: Bipolar I disorder with mania (HCC)  Diagnosis: Principal Problem:   Bipolar I disorder with mania  (HCC) Active Problems:   Delusional disorder (HCC)   Aggressive behavior   Psychosis (HCC)   Marijuana abuse, continuous  Total Time spent with patient: 25 minutes  Past Psychiatric History: ADHD  Past Medical History:  Past Medical History:  Diagnosis Date   Acne 08/2011   epiduo 2012   ADHD (attention deficit hyperactivity disorder) 2009   HTN on stimulants, off meds 03/2012   Allergic conjunctivitis 2012   Allergic rhinitis 08/2010   Learning disorder 08/2010   borderline IQ, had IEP in 2012   Obesity 08/13/2010   Prediabetes    HBA1C 6.0 03/2010, never took metromin in 2012-2014   SCFE (slipped capital femoral epiphysis) 2011    Past Surgical History:  Procedure Laterality Date   HIP PINNING  12/2009   Family History:  Family History  Problem Relation Age of Onset   Obesity Sister    Obesity Brother    Hypertension Maternal Grandmother    Diabetes Maternal Grandmother    Obesity Maternal Grandmother    Hypertension Maternal Grandfather    Obesity Mother    Diabetes Maternal Aunt    Obesity Maternal Aunt    Diabetes Cousin    Thyroid disease Neg Hx    Family Psychiatric  History: See H&P  Social History:  Social History   Substance and Sexual Activity  Alcohol Use Never   Alcohol/week: 0.0 standard drinks     Social History   Substance and Sexual Activity  Drug Use Not Currently   Types: Marijuana, Cocaine   Comment:  pt denies current drug use    Social History   Socioeconomic History   Marital status: Single    Spouse name: Not on file   Number of children: Not on file   Years of education: Not on file   Highest education level: Not on file  Occupational History   Not on file  Tobacco Use   Smoking status: Former Smoker    Types: Cigarettes   Smokeless tobacco: Never Used  Building services engineer Use: Never used  Substance and Sexual Activity   Alcohol use: Never    Alcohol/week: 0.0 standard drinks   Drug use:  Not Currently    Types: Marijuana, Cocaine    Comment: pt denies current drug use   Sexual activity: Not Currently    Birth control/protection: Implant    Comment: "taken out about a year ago. Don't need it now"  Other Topics Concern   Not on file  Social History Narrative   Not on file   Social Determinants of Health   Financial Resource Strain:    Difficulty of Paying Living Expenses: Not on file  Food Insecurity:    Worried About Running Out of Food in the Last Year: Not on file   Ran Out of Food in the Last Year: Not on file  Transportation Needs:    Lack of Transportation (Medical): Not on file   Lack of Transportation (Non-Medical): Not on file  Physical Activity:    Days of Exercise per Week: Not on file   Minutes of Exercise per Session: Not on file  Stress:    Feeling of Stress : Not on file  Social Connections:    Frequency of Communication with Friends and Family: Not on file   Frequency of Social Gatherings with Friends and Family: Not on file   Attends Religious Services: Not on file   Active Member of Clubs or Organizations: Not on file   Attends Banker Meetings: Not on file   Marital Status: Not on file   Additional Social History:   Sleep: Good  Appetite:  Good  Current Medications: Current Facility-Administered Medications  Medication Dose Route Frequency Provider Last Rate Last Admin   acetaminophen (TYLENOL) tablet 650 mg  650 mg Oral Q6H PRN Antonieta Pert, MD       alum & mag hydroxide-simeth (MAALOX/MYLANTA) 200-200-20 MG/5ML suspension 30 mL  30 mL Oral Q4H PRN Antonieta Pert, MD       hydrOXYzine (ATARAX/VISTARIL) tablet 25 mg  25 mg Oral TID PRN Rankin, [name] B, NP   25 mg at 11/17/19 2117   risperiDONE (RISPERDAL M-TABS) disintegrating tablet 2 mg  2 mg Oral Q8H PRN Antonieta Pert, MD       And   LORazepam (ATIVAN) tablet 1 mg  1 mg Oral PRN Antonieta Pert, MD       And   ziprasidone  (GEODON) injection 20 mg  20 mg Intramuscular PRN Antonieta Pert, MD       magnesium hydroxide (MILK OF MAGNESIA) suspension 30 mL  30 mL Oral Daily PRN Antonieta Pert, MD       risperiDONE (RISPERDAL M-TABS) disintegrating tablet 1 mg  1 mg Oral Daily Antonieta Pert, MD   1 mg at 11/20/19 0749   risperiDONE (RISPERDAL M-TABS) disintegrating tablet 2 mg  2 mg Oral QHS Antonieta Pert, MD   2 mg at 11/19/19 2042   traZODone (DESYREL) tablet 50 mg  50 mg Oral QHS PRN Antonieta Pert, MD   50 mg at 11/17/19 2118   Lab Results:  No results found for this or any previous visit (from the past 48 hour(s)). Blood Alcohol level:  Lab Results  Component Value Date   ETH <10 11/15/2019   ETH <10 05/27/2019   Metabolic Disorder Labs: Lab Results  Component Value Date   HGBA1C 5.0 11/19/2018   MPG 97 11/19/2018   Lab Results  Component Value Date   PROLACTIN 44.1 (H) 11/19/2018   Lab Results  Component Value Date   CHOL 170 11/19/2018   TRIG 50 11/19/2018   HDL 49 11/19/2018   CHOLHDL 3.5 11/19/2018   VLDL 10 11/19/2018   LDLCALC 111 (H) 11/19/2018   Physical Findings: AIMS: Facial and Oral Movements Muscles of Facial Expression: None, normal Lips and Perioral Area: None, normal Jaw: None, normal Tongue: None, normal,Extremity Movements Upper (arms, wrists, hands, fingers): None, normal Lower (legs, knees, ankles, toes): None, normal, Trunk Movements Neck, shoulders, hips: None, normal, Overall Severity Severity of abnormal movements (highest score from questions above): None, normal Incapacitation due to abnormal movements: None, normal Patient's awareness of abnormal movements (rate only patient's report): No Awareness, Dental Status Current problems with teeth and/or dentures?: No Does patient usually wear dentures?: No  CIWA:    COWS:     Musculoskeletal: Strength & Muscle Tone: within normal limits Gait & Station: normal Patient leans:  N/A  Psychiatric Specialty Exam: Physical Exam Vitals and nursing note reviewed.  Constitutional:      Appearance: She is well-developed.  HENT:     Nose: Nose normal.     Mouth/Throat:     Pharynx: Oropharynx is clear.  Eyes:     Pupils: Pupils are equal, round, and reactive to light.  Cardiovascular:     Rate and Rhythm: Normal rate.  Pulmonary:     Effort: Pulmonary effort is normal.  Genitourinary:    Comments: Deferred Musculoskeletal:        General: Normal range of motion.     Cervical back: Normal range of motion.  Skin:    General: Skin is warm.  Neurological:     Mental Status: She is alert and oriented to person, place, and time.     Review of Systems  Constitutional: Negative for chills and fever.  Respiratory: Negative for cough, shortness of breath and wheezing.   Cardiovascular: Negative for chest pain and palpitations.  Gastrointestinal: Negative for nausea and vomiting.  Neurological: Negative for dizziness and headaches.  Psychiatric/Behavioral: Positive for depression, hallucinations and substance abuse. Negative for memory loss and suicidal ideas. The patient is nervous/anxious. The patient does not have insomnia.     Blood pressure 123/68, pulse 85, temperature 97.9 F (36.6 C), temperature source Oral, resp. rate 18, height 5\' 8"  (1.727 m), weight 59.4 kg, SpO2 100 %.Body mass index is 19.92 kg/m.  General Appearance: Casual  Eye Contact:  Good  Speech:  Normal Rate  Volume:  Increased  Mood:  Dysphoric and Irritable, uncooperative, some improvement noted today.  Affect:  Labile  Thought Process:  Coherent and Descriptions of Associations: Loose  Orientation:  Full (Time, Place, and Person)  Thought Content:  Delusions, Paranoid Ideation and Rumination  Suicidal Thoughts:  No  Homicidal Thoughts:  No  Memory:  Immediate;   Poor Recent;   Poor Remote;   Poor  Judgement:  Impaired  Insight:  Lacking  Psychomotor Activity:  Increased   Concentration:  Concentration:  Fair and Attention Span: Fair  Recall:  [employer]is[employer]Fair  Fund of Knowledge:  Fair  Language:  Good  Akathisia:  Negative  Handed:  Right  AIMS (if indicated):     Assets:  Desire for Improvement Resilience  ADL's:  Intact  Cognition:  WNL    Sleep: 6.75     Treatment Plan Summary: Daily contact with patient to assess and evaluate symptoms and progress in treatment and Medication management.  Continue inpatient hospitalization. Will continue today 11/20/2019 plan as below except where it is noted.  Mood control.  Continue Risperdal-M-tabs 1 mg po daily.  Continue Risperdal M-tabs 2 mg po Q bedtime.  Anxiety.  Continue Vistaril 25 mg po tid prn.      Agitation/psychosis   Continue Risperdal M-Tabs 2 mg po Q 8 hrs prn.  &  Lorazepam 1 mg po prn x 1 dose.  &  Geodon 20 mg IM x 1 dose.  Insomnia.  Continue Risperdal 50 mg po Q hs prn    Encourage participation in groups and therapeutic milieu Disposition planning will be ongoing  Armandina StammerAgnes Edin Kon, NP, PMHNP, FNP-BC 11/20/2019, 3:54 PMPatient ID: Angie Revelsemetria Plato, female   DOB: [birth year], 19 y.o.   MRN: [id number]

## 2019-11-20 NOTE — Progress Notes (Signed)
   11/20/19 2214  Psych Admission Type (Psych Patients Only)  Admission Status Involuntary  Psychosocial Assessment  Patient Complaints None  Eye Contact Brief  Facial Expression Flat  Affect Blunted  Speech Logical/coherent  Interaction Guarded;Forwards little  Motor Activity Other (Comment) (WDL)  Appearance/Hygiene Unremarkable  Behavior Characteristics Irritable  Mood Labile  Thought Process  Coherency Circumstantial  Content Blaming others;Paranoia  Delusions None reported or observed  Perception WDL  Hallucination None reported or observed  Judgment Poor  Confusion None  Danger to Self  Current suicidal ideation? Denies  Danger to Others  Danger to Others None reported or observed

## 2019-11-20 NOTE — Progress Notes (Signed)
   11/20/19 0800  Psych Admission Type (Psych Patients Only)  Admission Status Involuntary  Psychosocial Assessment  Patient Complaints None  Eye Contact Brief  Facial Expression Flat  Affect Blunted  Speech Logical/coherent  Interaction Guarded;Isolative  Motor Activity Other (Comment) (WDL)  Appearance/Hygiene Unremarkable  Behavior Characteristics Anxious;Irritable  Mood Anxious;Irritable  Thought Process  Coherency Circumstantial  Content Paranoia;Blaming others  Delusions None reported or observed  Perception WDL  Hallucination None reported or observed  Judgment Poor  Confusion None  Danger to Self  Current suicidal ideation? Denies  Danger to Others  Danger to Others None reported or observed

## 2019-11-20 NOTE — [facility] Group Notes (Signed)
Date:  11/20/2019 Time:  1000-1045 Group Topic/Focus: PROGRESSIVE RELAXATION. A group where deep breathing is taught and tensing and relaxation muscle groups is used. Imagery is used as well.  Pts are asked to imagine 3 pillars that hold them up when they are not able to hold themselves up.  Participation Level:  Did not attend   Culley Hedeen A 11/20/2019    

## 2019-11-21 MED ORDER — RISPERIDONE MICROSPHERES ER 50 MG IM SRER
50.0000 mg | INTRAMUSCULAR | Status: DC
  Administered 2019-11-22: 50 mg via INTRAMUSCULAR
  Filled 2019-11-21: qty 2

## 2019-11-21 MED ORDER — RISPERIDONE 2 MG PO TBDP
3.0000 mg | ORAL_TABLET | Freq: Every day | ORAL | Status: DC
  Filled 2019-11-21 (×2): qty 1

## 2019-11-21 NOTE — Progress Notes (Signed)
Recreation Therapy Notes  Date: 9.13.21 Time: 1015 Location: [address] Hall Dayroom  Group Topic:  Goal Setting  Goal Area(s) Addresses:  Patient will be able to set goals for things they wish to accomplish. Patient will be able identify areas were improvement is needed.  Intervention: Worksheet  Activity: Life Goals.  Patients were given a worksheet that was broken into 6 categories (family, friends, work/school, spirituality, body and mental health).  Patiets were to identify areas where they are doing good, areas where they need to improve and set a goal to make that improvement.   Education:  Discharge Planning, Coping Skills, Goal Planning  Education Outcome: Acknowledges Education/In Group Clarification Provided/Needs Additional Education  Clinical Observations: Pt did not attend group.     Caroll Rancher , LRT/CTRS         Caroll Rancher A 11/21/2019 12:23 PM

## 2019-11-21 NOTE — Progress Notes (Addendum)
Pt came to the medication window" what doctor prescribed these medications". Pt was informed the same one that has been prescribing the medications since she was admitted Dr Jola Babinski. Pt proceeded to state he was not a real doctor and said writer was not a Company secretary. Writer informed pt that I was a Engineer, civil (consulting), and pt proceeded to Doctor, general practice was not a Set designer, pt continued to Regulatory affairs officer names  And did not take her night medications. Pt stated she was leaving tomorrow . Pt was informed she refused her Risperdal shot , pt stated she did not need medications.     11/21/19 2100  Psych Admission Type (Psych Patients Only)  Admission Status Involuntary  Psychosocial Assessment  Patient Complaints None  Eye Contact Brief  Facial Expression Flat  Affect Blunted  Speech Logical/coherent  Interaction Guarded;Forwards little  Motor Activity Other (Comment) (WDL)  Appearance/Hygiene Unremarkable  Behavior Characteristics Unwilling to participate;Agitated  Mood Labile  Thought Process  Coherency Circumstantial  Content Blaming others;Paranoia  Delusions None reported or observed  Perception WDL  Hallucination None reported or observed  Judgment Poor  Confusion None  Danger to Self  Current suicidal ideation? Denies  Danger to Others  Danger to Others None reported or observed

## 2019-11-21 NOTE — Progress Notes (Deleted)
Pt came to the medication window" what doctor prescribed these medications". Pt was informed the same on that has been prescribing the medications since she was admitted Dr Jola Babinski. Pt proceeded to state he was not a real doctor and said writer was not a Company secretary. Writer informed pt thet I was a Engineer, civil (consulting), and pt proceeded to Doctor, general practice was not a Set designer, pt continued to Regulatory affairs officer names  And did not take her night medications. Pt stated she was leaving tomorrow . Pt was informed she refused her Risperdal shot , pt stated she did not need medications.

## 2019-11-21 NOTE — Progress Notes (Signed)
Dar Note: Patient presents with irritable affect and mood.  Refused her prescribed medication stating she took it earlier during the day.  Remained withdrawn and isolates to her room.  Refused long acting Risperdal Consta.  MD made aware.  Routine safety checks maintained.  Patient is safe on the unit.

## 2019-11-21 NOTE — Progress Notes (Signed)
[facility]  11/21/2019 1:40 PM Angie RevelsDemetria Lopez  MRN:  [id number] Subjective:   Patient is a 19 year old female with a previous psychiatric history significant for delusional disorder versus acute psychosis versus most probably schizophrenia who [facility] emergency department on 11/15/2019 after having been brought to the emergency department by police. The patient was apparently arrested for larceny at a [employer] store and then refused to leave and was arrested. She made suicidal statements, and attempted to have the police shoot her. She was brought to the hospital for evaluation and stabilization.  Objective: Patient is seen and examined. Patient is a 19 year old female with the above-stated past psychiatric history who is seen in follow-up. Review of the electronic medical record since I last saw the patient on 11/17/2019. She had been started on Risperdal 1 mg p.o. daily and 2 mg p.o. nightly. She has remained isolated throughout the hospitalization. On 9/11 it was noted that she was aggressive and uncooperative. Her medications were not changed. On 9/12 it was noted she was a bit more cooperative that day. She stated that no one in her family knew she was here. She believes she was brought to the behavioral health hospital for no reason. Again, there was no change to her medications. On examination today she remains in her room most of the time, she remains in bed. She makes enough conversation and eye contact to satisfy the reviewer. She had previously been on the long-acting Risperdal injection. She still has minimal insight to her illness. Her vital signs are stable, she is afebrile. She slept 8.25 hours last night. Review of her laboratories from 9/7 revealed no new laboratories. Drug screen was never obtained. She denied auditory or visual hallucinations. She denied any suicidal or homicidal ideation.  Principal Problem: Bipolar I disorder with mania  (HCC) Diagnosis: Principal Problem:   Bipolar I disorder with mania (HCC) Active Problems:   Aggressive behavior   Psychosis (HCC)   Delusional disorder (HCC)   Marijuana abuse, continuous  Total Time spent with patient: 20 minutes  Past Psychiatric History: See admission H&P  Past Medical History:  Past Medical History:  Diagnosis Date  . Acne 08/2011   epiduo 2012  . ADHD (attention deficit hyperactivity disorder) 2009   HTN on stimulants, off meds 03/2012  . Allergic conjunctivitis 2012  . Allergic rhinitis 08/2010  . Learning disorder 08/2010   borderline IQ, had IEP in 2012  . Obesity 08/13/2010  . Prediabetes    HBA1C 6.0 03/2010, never took metromin in 2012-2014  . SCFE (slipped capital femoral epiphysis) 2011    Past Surgical History:  Procedure Laterality Date  . HIP PINNING  12/2009   Family History:  Family History  Problem Relation Age of Onset  . Obesity Sister   . Obesity Brother   . Hypertension Maternal Grandmother   . Diabetes Maternal Grandmother   . Obesity Maternal Grandmother   . Hypertension Maternal Grandfather   . Obesity Mother   . Diabetes Maternal Aunt   . Obesity Maternal Aunt   . Diabetes Cousin   . Thyroid disease Neg Hx    Family Psychiatric  History: See admission H&P Social History:  Social History   Substance and Sexual Activity  Alcohol Use Never  . Alcohol/week: 0.0 standard drinks     Social History   Substance and Sexual Activity  Drug Use Not Currently  . Types: Marijuana, Cocaine   Comment: pt denies current drug  use    Social History   Socioeconomic History  . Marital status: Single    Spouse name: Not on file  . Number of children: Not on file  . Years of education: Not on file  . Highest education level: Not on file  Occupational History  . Not on file  Tobacco Use  . Smoking status: Former Smoker    Types: Cigarettes  . Smokeless tobacco: Never Used  Vaping Use  . Vaping Use: Never used  Substance and  Sexual Activity  . Alcohol use: Never    Alcohol/week: 0.0 standard drinks  . Drug use: Not Currently    Types: Marijuana, Cocaine    Comment: pt denies current drug use  . Sexual activity: Not Currently    Birth control/protection: Implant    Comment: "taken out about a year ago. Don't need it now"  Other Topics Concern  . Not on file  Social History Narrative  . Not on file   Social Determinants of Health   Financial Resource Strain:   . Difficulty of Paying Living Expenses: Not on file  Food Insecurity:   . Worried About Programme researcher, broadcasting/film/video in the Last Year: Not on file  . Ran Out of Food in the Last Year: Not on file  Transportation Needs:   . Lack of Transportation (Medical): Not on file  . Lack of Transportation (Non-Medical): Not on file  Physical Activity:   . Days of Exercise per Week: Not on file  . Minutes of Exercise per Session: Not on file  Stress:   . Feeling of Stress : Not on file  Social Connections:   . Frequency of Communication with Friends and Family: Not on file  . Frequency of Social Gatherings with Friends and Family: Not on file  . Attends Religious Services: Not on file  . Active Member of Clubs or Organizations: Not on file  . Attends Banker Meetings: Not on file  . Marital Status: Not on file   Additional Social History:                         Sleep: Good  Appetite:  Fair  Current Medications: Current Facility-Administered Medications  Medication Dose Route Frequency Provider Last Rate Last Admin  . acetaminophen (TYLENOL) tablet 650 mg  650 mg Oral Q6H PRN Antonieta Pert, MD      . alum & mag hydroxide-simeth (MAALOX/MYLANTA) 200-200-20 MG/5ML suspension 30 mL  30 mL Oral Q4H PRN Antonieta Pert, MD      . hydrOXYzine (ATARAX/VISTARIL) tablet 25 mg  25 mg Oral TID PRN Rankin, [name] B, NP   25 mg at 11/17/19 2117  . risperiDONE (RISPERDAL M-TABS) disintegrating tablet 2 mg  2 mg Oral Q8H PRN Antonieta Pert, MD       And  . LORazepam (ATIVAN) tablet 1 mg  1 mg Oral PRN Antonieta Pert, MD       And  . ziprasidone (GEODON) injection 20 mg  20 mg Intramuscular PRN Antonieta Pert, MD      . magnesium hydroxide (MILK OF MAGNESIA) suspension 30 mL  30 mL Oral Daily PRN Antonieta Pert, MD      . risperiDONE (RISPERDAL M-TABS) disintegrating tablet 1 mg  1 mg Oral Daily Antonieta Pert, MD   1 mg at 11/20/19 0749  . risperiDONE (RISPERDAL M-TABS) disintegrating tablet 3 mg  3 mg Oral QHS Mickey Hebel,  Marlane Mingle, MD      . traZODone (DESYREL) tablet 50 mg  50 mg Oral QHS PRN Antonieta Pert, MD   50 mg at 11/17/19 2118    Lab Results: No results found for this or any previous visit (from the past 48 hour(s)).  Blood Alcohol level:  Lab Results  Component Value Date   ETH <10 11/15/2019   ETH <10 05/27/2019    Metabolic Disorder Labs: Lab Results  Component Value Date   HGBA1C 5.0 11/19/2018   MPG 97 11/19/2018   Lab Results  Component Value Date   PROLACTIN 44.1 (H) 11/19/2018   Lab Results  Component Value Date   CHOL 170 11/19/2018   TRIG 50 11/19/2018   HDL 49 11/19/2018   CHOLHDL 3.5 11/19/2018   VLDL 10 11/19/2018   LDLCALC 111 (H) 11/19/2018    Physical Findings: AIMS: Facial and Oral Movements Muscles of Facial Expression: None, normal Lips and Perioral Area: None, normal Jaw: None, normal Tongue: None, normal,Extremity Movements Upper (arms, wrists, hands, fingers): None, normal Lower (legs, knees, ankles, toes): None, normal, Trunk Movements Neck, shoulders, hips: None, normal, Overall Severity Severity of abnormal movements (highest score from questions above): None, normal Incapacitation due to abnormal movements: None, normal Patient's awareness of abnormal movements (rate only patient's report): No Awareness, Dental Status Current problems with teeth and/or dentures?: No Does patient usually wear dentures?: No  CIWA:    COWS:      Musculoskeletal: Strength & Muscle Tone: within normal limits Gait & Station: normal Patient leans: N/A  Psychiatric Specialty Exam: Physical Exam Vitals and nursing note reviewed.  Constitutional:      Appearance: Normal appearance.  HENT:     Head: Normocephalic and atraumatic.  Pulmonary:     Effort: Pulmonary effort is normal.  Neurological:     General: No focal deficit present.     Mental Status: She is alert.     Review of Systems  Blood pressure 123/68, pulse 85, temperature 97.9 F (36.6 C), temperature source Oral, resp. rate 18, height 5\' 8"  (1.727 m), weight 59.4 kg, SpO2 100 %.Body mass index is 19.92 kg/m.  General Appearance: Disheveled  Eye Contact:  Minimal  Speech:  Normal Rate  Volume:  Decreased  Mood:  Dysphoric and Irritable  Affect:  Congruent  Thought Process:  Coherent and Descriptions of Associations: Circumstantial  Orientation:  Full (Time, Place, and Person)  Thought Content:  Paranoid Ideation  Suicidal Thoughts:  No  Homicidal Thoughts:  No  Memory:  Immediate;   Poor Recent;   Poor Remote;   Poor  Judgement:  Impaired  Insight:  Lacking  Psychomotor Activity:  Decreased  Concentration:  Concentration: Fair and Attention Span: Fair  Recall:  Poor  Fund of Knowledge:  Poor  Language:  Fair  Akathisia:  Negative  Handed:  Right  AIMS (if indicated):     Assets:  Desire for Improvement Resilience  ADL's:  Intact  Cognition:  WNL  Sleep:  Number of Hours: 8.25     Treatment Plan Summary: Daily contact with patient to assess and evaluate symptoms and progress in treatment, Medication management and Plan : Patient is seen and examined. Patient is a 19 year old female with the above-stated past psychiatric history who is seen in follow-up.   Diagnosis: 1. Schizophrenia 2. History of cannabis use disorder  Pertinent findings on examination today: 1. Limited insight about her illness continues. 2. Denial of all symptoms. 3. No  side effects  of medications.  Plan: 1. Increase Risperdal to 1 mg p.o. daily and 3 mg p.o. nightly for psychosis. 2. Attempt to give the Risperdal long-acting injection today. We will start with 50 mg IM x1 given her lack of compliance in the past. 3. Continue hydroxyzine 25 mg p.o. 3 times daily as needed anxiety. 4. Continue Risperdal 2 mg every 8 hours as needed agitation. 5. Continue trazodone 50 mg p.o. nightly as needed insomnia. 6. Disposition planning-in progress.  Antonieta Pert, MD 11/21/2019, 1:40 PM

## 2019-11-22 DIAGNOSIS — F250 Schizoaffective disorder, bipolar type: Secondary | ICD-10-CM

## 2019-11-22 MED ORDER — RISPERIDONE MICROSPHERES ER 50 MG IM SRER
50.0000 mg | INTRAMUSCULAR | 0 refills | Status: DC
Start: 2019-12-05 — End: 2020-06-18

## 2019-11-22 MED ORDER — RISPERIDONE 2 MG PO TABS
2.0000 mg | ORAL_TABLET | Freq: Every day | ORAL | Status: DC
  Filled 2019-11-22: qty 1

## 2019-11-22 MED ORDER — RISPERIDONE 1 MG PO TABS
ORAL_TABLET | ORAL | 0 refills | Status: DC
Start: 2019-11-22 — End: 2020-06-18

## 2019-11-22 MED ORDER — RISPERIDONE 1 MG PO TABS
1.0000 mg | ORAL_TABLET | Freq: Every day | ORAL | Status: DC
  Administered 2019-11-22: 1 mg via ORAL
  Filled 2019-11-22 (×2): qty 1

## 2019-11-22 NOTE — [facility] Suicide Risk Assessment (Signed)
[facility] Discharge Suicide Risk Assessment   Principal Problem: Bipolar I disorder with mania (HCC) Discharge Diagnoses: Principal Problem:   Bipolar I disorder with mania (HCC) Active Problems:   Aggressive behavior   Psychosis (HCC)   Delusional disorder (HCC)   Marijuana abuse, continuous   Total Time spent with patient: 20 minutes  Musculoskeletal: Strength & Muscle Tone: within normal limits Gait & Station: normal Patient leans: N/A  Psychiatric Specialty Exam: Review of Systems  All other systems reviewed and are negative.   Blood pressure 123/68, pulse 85, temperature 97.9 F (36.6 C), temperature source Oral, resp. rate 18, height 5\' 8"  (1.727 m), weight 59.4 kg, SpO2 100 %.Body mass index is 19.92 kg/m.  General Appearance: Casual  Eye Contact::  Fair  Speech:  Normal Rate409  Volume:  Normal  Mood:  Dysphoric  Affect:  Congruent  Thought Process:  Coherent and Descriptions of Associations: Circumstantial  Orientation:  Full (Time, Place, and Person)  Thought Content:  Delusions  Suicidal Thoughts:  No  Homicidal Thoughts:  No  Memory:  Immediate;   Fair Recent;   Fair Remote;   Fair  Judgement:  Impaired  Insight:  Lacking  Psychomotor Activity:  Normal  Concentration:  Fair  Recall:  [phone] of Knowledge:Fair  Language: Good  Akathisia:  Negative  Handed:  Right  AIMS (if indicated):     Assets:  Desire for Improvement Resilience  Sleep:  Number of Hours: 6.25  Cognition: WNL  ADL's:  Intact   Mental Status Per Nursing Assessment::   On Admission:  NA  Demographic Factors:  Adolescent or young adult, Low socioeconomic status and Living alone  Loss Factors: NA  Historical Factors: Impulsivity  Risk Reduction Factors:   NA  Continued Clinical Symptoms:  Schizophrenia:   Less than 44 years old Paranoid or undifferentiated type  Cognitive Features That Contribute To Risk:  Thought constriction (tunnel vision)    Suicide Risk:   Minimal: No identifiable suicidal ideation.  Patients presenting with no risk factors but with morbid ruminations; may be classified as minimal risk based on the severity of the depressive symptoms    Plan Of Care/Follow-up recommendations:  Activity:  ad lib  41, MD 11/22/2019, 11:40 AM

## 2019-11-22 NOTE — Discharge Summary (Signed)
Physician Discharge Summary Note  Patient:  Angie Lopez is an 19 y.o., female MRN:  [id number] DOB:  08-08-2000 Patient phone:  [phone] (home)  Patient address:   [address] [place] [address],  Total Time spent with patient: 30 minutes  Date of Admission:  11/16/2019 Date of Discharge: 11/22/2019  Reason for Admission: Psychosis  Principal Problem: Bipolar I disorder with mania (HCC) Discharge Diagnoses: Principal Problem:   Bipolar I disorder with mania (HCC) Active Problems:   Aggressive behavior   Psychosis (HCC)   Delusional disorder (HCC)   Marijuana abuse, continuous   Past Psychiatric History: See admission H&P  Past Medical History:  Past Medical History:  Diagnosis Date   Acne 08/2011   epiduo 2012   ADHD (attention deficit hyperactivity disorder) 2009   HTN on stimulants, off meds 03/2012   Allergic conjunctivitis 2012   Allergic rhinitis 08/2010   Learning disorder 08/2010   borderline IQ, had IEP in 2012   Obesity 08/13/2010   Prediabetes    HBA1C 6.0 03/2010, never took metromin in 2012-2014   SCFE (slipped capital femoral epiphysis) 2011    Past Surgical History:  Procedure Laterality Date   HIP PINNING  12/2009   Family History:  Family History  Problem Relation Age of Onset   Obesity Sister    Obesity Brother    Hypertension Maternal Grandmother    Diabetes Maternal Grandmother    Obesity Maternal Grandmother    Hypertension Maternal Grandfather    Obesity Mother    Diabetes Maternal Aunt    Obesity Maternal Aunt    Diabetes Cousin    Thyroid disease Neg Hx    Family Psychiatric  History: See admission H&P Social History:  Social History   Substance and Sexual Activity  Alcohol Use Never   Alcohol/week: 0.0 standard drinks     Social History   Substance and Sexual Activity  Drug Use Not Currently   Types: Marijuana, Cocaine   Comment: pt denies current drug use    Social History    Socioeconomic History   Marital status: Single    Spouse name: Not on file   Number of children: Not on file   Years of education: Not on file   Highest education level: Not on file  Occupational History   Not on file  Tobacco Use   Smoking status: Former Smoker    Types: Cigarettes   Smokeless tobacco: Never Used  Building services engineer Use: Never used  Substance and Sexual Activity   Alcohol use: Never    Alcohol/week: 0.0 standard drinks   Drug use: Not Currently    Types: Marijuana, Cocaine    Comment: pt denies current drug use   Sexual activity: Not Currently    Birth control/protection: Implant    Comment: "taken out about a year ago. Don't need it now"  Other Topics Concern   Not on file  Social History Narrative   Not on file   Social Determinants of Health   Financial Resource Strain:    Difficulty of Paying Living Expenses: Not on file  Food Insecurity:    Worried About Running Out of Food in the Last Year: Not on file   Ran Out of Food in the Last Year: Not on file  Transportation Needs:    Lack of Transportation (Medical): Not on file   Lack of Transportation (Non-Medical): Not on file  Physical Activity:    Days of Exercise per Week: Not  on file   Minutes of Exercise per Session: Not on file  Stress:    Feeling of Stress : Not on file  Social Connections:    Frequency of Communication with Friends and Family: Not on file   Frequency of Social Gatherings with Friends and Family: Not on file   Attends Religious Services: Not on file   Active Member of Clubs or Organizations: Not on file   Attends BankerClub or Organization Meetings: Not on file   Marital Status: Not on file    Hospital Course: Patient is seen and examined.  Patient is a 19 year old female with a previous psychiatric history significant for several diagnosis, but most probably schizophrenia who [facility] emergency department on  11/15/2019 after having been brought in by police.  The patient was apparently arrested for larceny at a [employer] store.  She refused to leave and was arrested.  It was also reported that later she made suicidal statements and was apparently attempting to get the police to shoot her.  She was assessed by the comprehensive clinical assessment note, and stated she was being followed by "some girls who wanted to find her".  According to the involuntary commitment paperwork per the police she had asked the police to shoot her.  She was noted to be irritable on admission, and stated she had not been taking any medication since her last hospitalization.  She stated that she did not need any medication.  She had previously received Caplyta as well as Risperdal Consta.  Her last injection was on 06/04/2019.  She was admitted to the hospital.  We started her on Risperdal 1 mg p.o. daily and 2 mg p.o. nightly.  The plan was to give her the long-acting Risperdal injection or the long-acting paliperidone injection prior to discharge.  She remained in her room for the majority of the course of the hospitalization.  She denied most symptoms.  She stated that she did not know why she was in the hospital, and there was no reason for her to have to take medications.  She was basically compliant with medication until the last 2 days of the hospitalization.  When it was time for her the have the long-acting Risperdal injection she refused oral medication as well, and became irritable and stated that she had no medical problems, did not need to be in the hospital, that I was not a real doctor, and then the nurse on the ward was not a real nurse.  After discussion with the patient she agreed to take the long-acting injection as well as the dose of Risperdal she had refused.  She cooperated with this knowing that she would be discharged if she was compliant.  Throughout the course of the hospitalization she denied any suicidal or  homicidal ideation.  She did have delusional thinking, but this was thought to be at her baseline.  She had no active auditory or visual hallucinations.  Her laboratories on admission were all essentially normal.  A drug screen was never obtained.  She does have a history of marijuana dependence.  Her urine pregnancy test was negative.  It was decided she could be discharged this date.  Physical Findings: AIMS: Facial and Oral Movements Muscles of Facial Expression: None, normal Lips and Perioral Area: None, normal Jaw: None, normal Tongue: None, normal,Extremity Movements Upper (arms, wrists, hands, fingers): None, normal Lower (legs, knees, ankles, toes): None, normal, Trunk Movements Neck, shoulders, hips: None, normal, Overall  Severity Severity of abnormal movements (highest score from questions above): None, normal Incapacitation due to abnormal movements: None, normal Patient's awareness of abnormal movements (rate only patient's report): No Awareness, Dental Status Current problems with teeth and/or dentures?: No Does patient usually wear dentures?: No  CIWA:    COWS:     Musculoskeletal: Strength & Muscle Tone: within normal limits Gait & Station: normal Patient leans: N/A  Psychiatric Specialty Exam: Physical Exam Vitals and nursing note reviewed.  Constitutional:      Appearance: Normal appearance.  HENT:     Head: Normocephalic and atraumatic.  Pulmonary:     Effort: Pulmonary effort is normal.  Neurological:     General: No focal deficit present.     Mental Status: She is alert.     Review of Systems  Blood pressure 123/68, pulse 85, temperature 97.9 F (36.6 C), temperature source Oral, resp. rate 18, height 5\' 8"  (1.727 m), weight 59.4 kg, SpO2 100 %.Body mass index is 19.92 kg/m.  General Appearance: Casual  Eye Contact:  Fair  Speech:  Normal Rate  Volume:  Normal  Mood:  Euthymic  Affect:  Congruent  Thought Process:  Goal Directed and Descriptions of  Associations: Loose  Orientation:  Full (Time, Place, and Person)  Thought Content:  Delusions and Paranoid Ideation  Suicidal Thoughts:  No  Homicidal Thoughts:  No  Memory:  Immediate;   Fair Recent;   Fair Remote;   Fair  Judgement:  Impaired  Insight:  Lacking  Psychomotor Activity:  Normal  Concentration:  Concentration: Fair and Attention Span: Fair  Recall:  of Knowledge:  Fair  Language:  Good  Akathisia:  Negative  Handed:  Right  AIMS (if indicated):     Assets:  Desire for Improvement Resilience  ADL's:  Intact  Cognition:  WNL  Sleep:  Number of Hours: 6.25     Have you used any form of tobacco in the last 30 days? (Cigarettes, Smokeless Tobacco, Cigars, and/or Pipes): Patient Refused Screening  Has this patient used any form of tobacco in the last 30 days? (Cigarettes, Smokeless Tobacco, Cigars, and/or Pipes) Yes, No  Blood Alcohol level:  Lab Results  Component Value Date   ETH <10 11/15/2019   ETH <10 05/27/2019    Metabolic Disorder Labs:  Lab Results  Component Value Date   HGBA1C 5.0 11/19/2018   MPG 97 11/19/2018   Lab Results  Component Value Date   PROLACTIN 44.1 (H) 11/19/2018   Lab Results  Component Value Date   CHOL 170 11/19/2018   TRIG 50 11/19/2018   HDL 49 11/19/2018   CHOLHDL 3.5 11/19/2018   VLDL 10 11/19/2018   LDLCALC 111 (H) 11/19/2018    See Psychiatric Specialty Exam and Suicide Risk Assessment completed by Attending Physician prior to discharge.  Discharge destination:  Home  Is patient on multiple antipsychotic therapies at discharge:  No   Has Patient had three or more failed trials of antipsychotic monotherapy by history:  No  Recommended Plan for Multiple Antipsychotic Therapies: NA  Discharge Instructions    Diet - low sodium heart healthy   Complete by: As directed    Increase activity slowly   Complete by: As directed    Increase activity slowly   Complete by: As directed    Increase  activity slowly   Complete by: As directed      Allergies as of 11/22/2019   No Known Allergies  Medication List    STOP taking these medications   Caplyta 42 MG Caps Generic drug: Lumateperone Tosylate   FLUoxetine 20 MG capsule Commonly known as: PROzac   omega-3 acid ethyl esters 1 g capsule Commonly known as: LOVAZA   prenatal multivitamin Tabs tablet   risperiDONE microspheres 37.5 MG injection Commonly known as: RISPERDAL CONSTA Replaced by: risperiDONE microspheres 50 MG injection   temazepam 30 MG capsule Commonly known as: RESTORIL     TAKE these medications     Indication  risperiDONE 1 MG tablet Commonly known as: RISPERDAL 1 tab po q day and 2 tabs po q hs  Indication: Schizophrenia   risperiDONE microspheres 50 MG injection Commonly known as: RISPERDAL CONSTA Inject 2 mLs (50 mg total) into the muscle every 14 (fourteen) days. Due on 12/06/19 Start taking on: December 05, 2019 Replaces: risperiDONE microspheres 37.5 MG injection  Indication: Schizophrenia       Follow-up Information    [facility]. Go to.   Specialty: Urgent Care Why: [facility] can see patient for her 2nd Risperdol injection which is due on Monday, 12/05/19.  Please go there in that time frame and they will initiate medication management and therapy. Contact information: [phone] [place] [address] [phone]              Follow-up recommendations:  Activity:  Ad lib.  Comments: Take medications as directed, do not use marijuana, follow-up with psychiatry as outlined.  Signed: Antonieta Pert, MD 11/22/2019, 3:17 PM

## 2019-11-22 NOTE — Progress Notes (Signed)
Discharge Note:  Patient denies SI/HI AVH at this time. Discharge instructions, AVS, prescriptions and transition record gone over with patient. Patient agrees to comply with medication management, follow-up visit, and outpatient therapy. Patient belongings returned to patient. Patient questions and concerns addressed and answered.  Patient ambulatory off unit.  Patient discharged to home.   

## 2019-11-22 NOTE — Progress Notes (Signed)
[facility] MD Progress Note  11/22/2019 10:43 AM Angie Lopez  MRN:  [id number] Subjective:  Patient is a 19 year old female with a previous psychiatric history significant for delusional disorder versus acute psychosis versus most probably schizophrenia who presented to the [facility] emergency department on 11/15/2019 after having been brought to the emergency department by police. The patient was apparently arrested for larceny at a [employer] store and then refused to leave and was arrested. She made suicidal statements, and attempted to have the police shoot her. She was brought to the hospital for evaluation and stabilization.  Objective: Patient is seen and examined. Patient is a 19 year old female with the above-stated past psychiatric history seen in follow-up. We attempted to give her the long-acting Risperdal injection prior to discharge in the next 1 to 2 days last night. She refused that. She refused oral Risperdal this morning. She again refused the long-acting Risperdal injection. She stated that I am not a real doctor. She stated she is going to call the police, and have me arrested. She stated that the nurse is not a Set designer. She knows this to be true. I explained to her that prior to discharge she would have to be compliant with oral medications as well as the long-acting injection. This is essentially the same scenario we had on her last admission. She does remain paranoid, and will have to stay in the hospital until improvement of this. Her vital signs are stable, she is afebrile. She slept 6.25 hours last night. No new laboratories.  Principal Problem: Bipolar I disorder with mania (HCC) Diagnosis: Principal Problem:   Bipolar I disorder with mania (HCC) Active Problems:   Aggressive behavior   Psychosis (HCC)   Delusional disorder (HCC)   Marijuana abuse, continuous  Total Time spent with patient: 20 minutes  Past Psychiatric History: See admission  H&P  Past Medical History:  Past Medical History:  Diagnosis Date  . Acne 08/2011   epiduo 2012  . ADHD (attention deficit hyperactivity disorder) 2009   HTN on stimulants, off meds 03/2012  . Allergic conjunctivitis 2012  . Allergic rhinitis 08/2010  . Learning disorder 08/2010   borderline IQ, had IEP in 2012  . Obesity 08/13/2010  . Prediabetes    HBA1C 6.0 03/2010, never took metromin in 2012-2014  . SCFE (slipped capital femoral epiphysis) 2011    Past Surgical History:  Procedure Laterality Date  . HIP PINNING  12/2009   Family History:  Family History  Problem Relation Age of Onset  . Obesity Sister   . Obesity Brother   . Hypertension Maternal Grandmother   . Diabetes Maternal Grandmother   . Obesity Maternal Grandmother   . Hypertension Maternal Grandfather   . Obesity Mother   . Diabetes Maternal Aunt   . Obesity Maternal Aunt   . Diabetes Cousin   . Thyroid disease Neg Hx    Family Psychiatric  History: See admission H&P Social History:  Social History   Substance and Sexual Activity  Alcohol Use Never  . Alcohol/week: 0.0 standard drinks     Social History   Substance and Sexual Activity  Drug Use Not Currently  . Types: Marijuana, Cocaine   Comment: pt denies current drug use    Social History   Socioeconomic History  . Marital status: Single    Spouse name: Not on file  . Number of children: Not on file  . Years of education: Not on file  . Highest education level:  Not on file  Occupational History  . Not on file  Tobacco Use  . Smoking status: Former Smoker    Types: Cigarettes  . Smokeless tobacco: Never Used  Vaping Use  . Vaping Use: Never used  Substance and Sexual Activity  . Alcohol use: Never    Alcohol/week: 0.0 standard drinks  . Drug use: Not Currently    Types: Marijuana, Cocaine    Comment: pt denies current drug use  . Sexual activity: Not Currently    Birth control/protection: Implant    Comment: "taken out about a year  ago. Don't need it now"  Other Topics Concern  . Not on file  Social History Narrative  . Not on file   Social Determinants of Health   Financial Resource Strain:   . Difficulty of Paying Living Expenses: Not on file  Food Insecurity:   . Worried About Programme researcher, broadcasting/film/video in the Last Year: Not on file  . Ran Out of Food in the Last Year: Not on file  Transportation Needs:   . Lack of Transportation (Medical): Not on file  . Lack of Transportation (Non-Medical): Not on file  Physical Activity:   . Days of Exercise per Week: Not on file  . Minutes of Exercise per Session: Not on file  Stress:   . Feeling of Stress : Not on file  Social Connections:   . Frequency of Communication with Friends and Family: Not on file  . Frequency of Social Gatherings with Friends and Family: Not on file  . Attends Religious Services: Not on file  . Active Member of Clubs or Organizations: Not on file  . Attends Banker Meetings: Not on file  . Marital Status: Not on file   Additional Social History:                         Sleep: Good  Appetite:  Fair  Current Medications: Current Facility-Administered Medications  Medication Dose Route Frequency Provider Last Rate Last Admin  . acetaminophen (TYLENOL) tablet 650 mg  650 mg Oral Q6H PRN Antonieta Pert, MD      . alum & mag hydroxide-simeth (MAALOX/MYLANTA) 200-200-20 MG/5ML suspension 30 mL  30 mL Oral Q4H PRN Antonieta Pert, MD      . hydrOXYzine (ATARAX/VISTARIL) tablet 25 mg  25 mg Oral TID PRN Rankin, [name] B, NP   25 mg at 11/17/19 2117  . risperiDONE (RISPERDAL M-TABS) disintegrating tablet 2 mg  2 mg Oral Q8H PRN Antonieta Pert, MD       And  . LORazepam (ATIVAN) tablet 1 mg  1 mg Oral PRN Antonieta Pert, MD       And  . ziprasidone (GEODON) injection 20 mg  20 mg Intramuscular PRN Antonieta Pert, MD      . magnesium hydroxide (MILK OF MAGNESIA) suspension 30 mL  30 mL Oral Daily PRN Antonieta Pert, MD      . risperiDONE (RISPERDAL M-TABS) disintegrating tablet 1 mg  1 mg Oral Daily Antonieta Pert, MD   1 mg at 11/20/19 0749  . risperiDONE (RISPERDAL M-TABS) disintegrating tablet 3 mg  3 mg Oral QHS Antonieta Pert, MD      . risperiDONE microspheres (RISPERDAL CONSTA) injection 50 mg  50 mg Intramuscular Q14 Days Antonieta Pert, MD      . traZODone (DESYREL) tablet 50 mg  50 mg Oral QHS PRN Jola Babinski,  Marlane Mingle, MD   50 mg at 11/17/19 2118    Lab Results: No results found for this or any previous visit (from the past 48 hour(s)).  Blood Alcohol level:  Lab Results  Component Value Date   ETH <10 11/15/2019   ETH <10 05/27/2019    Metabolic Disorder Labs: Lab Results  Component Value Date   HGBA1C 5.0 11/19/2018   MPG 97 11/19/2018   Lab Results  Component Value Date   PROLACTIN 44.1 (H) 11/19/2018   Lab Results  Component Value Date   CHOL 170 11/19/2018   TRIG 50 11/19/2018   HDL 49 11/19/2018   CHOLHDL 3.5 11/19/2018   VLDL 10 11/19/2018   LDLCALC 111 (H) 11/19/2018    Physical Findings: AIMS: Facial and Oral Movements Muscles of Facial Expression: None, normal Lips and Perioral Area: None, normal Jaw: None, normal Tongue: None, normal,Extremity Movements Upper (arms, wrists, hands, fingers): None, normal Lower (legs, knees, ankles, toes): None, normal, Trunk Movements Neck, shoulders, hips: None, normal, Overall Severity Severity of abnormal movements (highest score from questions above): None, normal Incapacitation due to abnormal movements: None, normal Patient's awareness of abnormal movements (rate only patient's report): No Awareness, Dental Status Current problems with teeth and/or dentures?: No Does patient usually wear dentures?: No  CIWA:    COWS:     Musculoskeletal: Strength & Muscle Tone: within normal limits Gait & Station: normal Patient leans: N/A  Psychiatric Specialty Exam: Physical Exam Vitals and nursing  note reviewed.  HENT:     Head: Normocephalic and atraumatic.  Pulmonary:     Effort: Pulmonary effort is normal.  Neurological:     General: No focal deficit present.     Mental Status: She is alert.     Review of Systems  Blood pressure 123/68, pulse 85, temperature 97.9 F (36.6 C), temperature source Oral, resp. rate 18, height 5\' 8"  (1.727 m), weight 59.4 kg, SpO2 100 %.Body mass index is 19.92 kg/m.  General Appearance: Casual  Eye Contact:  Good  Speech:  Normal Rate  Volume:  Increased  Mood:  Dysphoric and Irritable  Affect:  Labile  Thought Process:  Goal Directed and Descriptions of Associations: Loose  Orientation:  Negative  Thought Content:  Delusions and Paranoid Ideation  Suicidal Thoughts:  No  Homicidal Thoughts:  No  Memory:  Immediate;   Fair Recent;   Fair Remote;   Fair  Judgement:  Impaired  Insight:  Lacking  Psychomotor Activity:  Increased  Concentration:  Concentration: Fair and Attention Span: Fair  Recall:  of Knowledge:  Fair  Language:  Good  Akathisia:  Negative  Handed:  Right  AIMS (if indicated):     Assets:  Desire for Improvement Resilience  ADL's:  Intact  Cognition:  WNL  Sleep:  Number of Hours: 6.25     Treatment Plan Summary: Daily contact with patient to assess and evaluate symptoms and progress in treatment, Medication management and Plan : Patient is seen and examined. Patient is a 19 year old female with the above-stated past psychiatric history who is seen in follow-up.   Diagnosis: 1. Schizophrenia 2. History of cannabis use disorder  Pertinent findings on examination today: 1. Limited insight about her illness. 2. Paranoia about staff and physician not being real. 3. Patient has refused oral medications as well as long-acting injectable.  Plan: 1. Continue Risperdal 1 mg p.o. daily and 3 mg p.o. nightly for psychosis. 2. Give the long-acting Risperdal injection 50  mg IM x1 given history of  noncompliance and history of psychosis. 3. Continue hydroxyzine 25 mg p.o. 3 times daily as needed anxiety. 4. Continue Risperdal 2 mg p.o. every 8 hours as needed agitation. 5. Continue trazodone 50 mg p.o. nightly as needed insomnia. 6. Institute agitation protocol with Risperdal as needed. 7. Disposition planning-patient will have to be compliant with medications prior to discharge.  Antonieta PertGreg Lawson Mikhail Hallenbeck, MD 11/22/2019, 10:43 AM

## 2019-11-22 NOTE — Progress Notes (Signed)
  [facility] Adult Case Management Discharge Plan :  Will you be returning to the same living situation after discharge:  Yes,  to home At discharge, do you have transportation home?: No. Safe Transport to be arranged Do you have the ability to pay for your medications: Yes,  has insurance  Release of information consent forms completed and in the chart;  Patient's signature needed at discharge.  Patient to Follow up at:   Next level of care provider has access to [facility] Link:no  Safety Planning and Suicide Prevention discussed: Yes,  with patient  Have you used any form of tobacco in the last 30 days? (Cigarettes, Smokeless Tobacco, Cigars, and/or Pipes): Patient Refused Screening  Has patient been referred to the Quitline?: Patient refused referral  Patient has been referred for addiction treatment: Pt. refused referral  Otelia Santee, LCSW 11/22/2019, 12:18 PM

## 2020-06-01 DIAGNOSIS — Z0289 Encounter for other administrative examinations: Secondary | ICD-10-CM

## 2020-06-12 ENCOUNTER — Emergency Department (HOSPITAL_COMMUNITY)
Admission: EM | Admit: 2020-06-12 | Discharge: 2020-06-13 | Disposition: A | Discharge status: Home / Self Care | Payer: Medicaid Other | Attending: Emergency Medicine | Admitting: Emergency Medicine

## 2020-06-12 DIAGNOSIS — F1210 Cannabis abuse, uncomplicated: Secondary | ICD-10-CM | POA: Diagnosis not present

## 2020-06-12 DIAGNOSIS — F22 Delusional disorders: Secondary | ICD-10-CM | POA: Insufficient documentation

## 2020-06-12 DIAGNOSIS — E876 Hypokalemia: Secondary | ICD-10-CM

## 2020-06-12 DIAGNOSIS — Z20822 Contact with and (suspected) exposure to covid-19: Secondary | ICD-10-CM | POA: Diagnosis not present

## 2020-06-12 DIAGNOSIS — F1490 Cocaine use, unspecified, uncomplicated: Secondary | ICD-10-CM | POA: Diagnosis not present

## 2020-06-12 DIAGNOSIS — F310 Bipolar disorder, current episode hypomanic: Secondary | ICD-10-CM | POA: Insufficient documentation

## 2020-06-12 DIAGNOSIS — R000 Tachycardia, unspecified: Secondary | ICD-10-CM | POA: Insufficient documentation

## 2020-06-12 DIAGNOSIS — F909 Attention-deficit hyperactivity disorder, unspecified type: Secondary | ICD-10-CM | POA: Insufficient documentation

## 2020-06-12 DIAGNOSIS — Z87891 Personal history of nicotine dependence: Secondary | ICD-10-CM | POA: Diagnosis not present

## 2020-06-12 DIAGNOSIS — R454 Irritability and anger: Secondary | ICD-10-CM | POA: Diagnosis not present

## 2020-06-12 DIAGNOSIS — F4321 Adjustment disorder with depressed mood: Secondary | ICD-10-CM | POA: Insufficient documentation

## 2020-06-12 DIAGNOSIS — F23 Brief psychotic disorder: Secondary | ICD-10-CM

## 2020-06-12 DIAGNOSIS — R456 Violent behavior: Secondary | ICD-10-CM | POA: Insufficient documentation

## 2020-06-12 DIAGNOSIS — Z046 Encounter for general psychiatric examination, requested by authority: Secondary | ICD-10-CM | POA: Diagnosis not present

## 2020-06-12 DIAGNOSIS — F29 Unspecified psychosis not due to a substance or known physiological condition: Secondary | ICD-10-CM | POA: Diagnosis not present

## 2020-06-12 LAB — CBC
HCT: 39.6 % (ref 36.0–46.0)
Hemoglobin: 13.2 g/dL (ref 12.0–15.0)
MCH: 29.4 pg (ref 26.0–34.0)
MCHC: 33.3 g/dL (ref 30.0–36.0)
MCV: 88.2 fL (ref 80.0–100.0)
Platelets: 280 10*3/uL (ref 150–400)
RBC: 4.49 MIL/uL (ref 3.87–5.11)
RDW: 12.7 % (ref 11.5–15.5)
WBC: 9 10*3/uL (ref 4.0–10.5)
nRBC: 0 % (ref 0.0–0.2)

## 2020-06-12 LAB — COMPREHENSIVE METABOLIC PANEL
ALT: 13 U/L (ref 0–44)
AST: 24 U/L (ref 15–41)
Albumin: 4.4 g/dL (ref 3.5–5.0)
Alkaline Phosphatase: 65 U/L (ref 38–126)
Anion gap: 11 (ref 5–15)
BUN: 8 mg/dL (ref 6–20)
CO2: 21 mmol/L — ABNORMAL LOW (ref 22–32)
Calcium: 9.4 mg/dL (ref 8.9–10.3)
Chloride: 104 mmol/L (ref 98–111)
Creatinine, Ser: 0.96 mg/dL (ref 0.44–1.00)
GFR, Estimated: >60 mL/min (ref >60)
Glucose, Bld: 102 mg/dL — ABNORMAL HIGH (ref 70–99)
Potassium: 3.1 mmol/L — ABNORMAL LOW (ref 3.5–5.1)
Sodium: 136 mmol/L (ref 135–145)
Total Bilirubin: 0.9 mg/dL (ref 0.3–1.2)
Total Protein: 8.2 g/dL — ABNORMAL HIGH (ref 6.5–8.1)

## 2020-06-12 LAB — RESP PANEL BY RT-PCR (FLU A&B, COVID) ARPGX2
Influenza A by PCR: NEGATIVE
Influenza B by PCR: NEGATIVE
SARS Coronavirus 2 by RT PCR: NEGATIVE

## 2020-06-12 LAB — I-STAT BETA HCG BLOOD, ED (MC, WL, AP ONLY): I-stat hCG, quantitative: <5 m[IU]/mL (ref <5)

## 2020-06-12 MED ORDER — ZIPRASIDONE MESYLATE 20 MG IM SOLR: 20.0000 mg | INTRAMUSCULAR | Status: DC | PRN

## 2020-06-12 MED ORDER — LORAZEPAM 1 MG PO TABS: 1.0000 mg | ORAL_TABLET | ORAL | Status: DC | PRN

## 2020-06-12 MED ORDER — LORAZEPAM 1 MG PO TABS: 1.0000 mg | ORAL_TABLET | Freq: Once | ORAL | Status: DC

## 2020-06-12 MED ORDER — ACETAMINOPHEN 325 MG PO TABS: 650.0000 mg | ORAL_TABLET | ORAL | Status: DC | PRN

## 2020-06-12 MED ORDER — RISPERIDONE 2 MG PO TBDP
2.0000 mg | ORAL_TABLET | Freq: Three times a day (TID) | ORAL | Status: DC | PRN
  Filled 2020-06-12: qty 1

## 2020-06-12 NOTE — ED Provider Notes (Signed)
[facility] EMERGENCY DEPARTMENT Provider Note   CSN: [id number] Arrival date & time: 06/12/20  1440     History Chief Complaint  Patient presents with  . Psychiatric Evaluation    Angie Lopez is a 20 y.o. female.  20 year old female with history of bipolar and psychosis presents under IVC due to increased delusions as well as psychosis.  Patient herself denies any SI or HI.  She is very aggressive and not willing to answer many questions at this time.  Has been cooperative with the police at this time.  Denies any current ingestions of alcohol or illicit drugs.  No history of chemical due to her current state        Past Medical History:  Diagnosis Date  . Acne 08/2011   epiduo 2012  . ADHD (attention deficit hyperactivity disorder) 2009   HTN on stimulants, off meds 03/2012  . Allergic conjunctivitis 2012  . Allergic rhinitis 08/2010  . Learning disorder 08/2010   borderline IQ, had IEP in 2012  . Obesity 08/13/2010  . Prediabetes    HBA1C 6.0 03/2010, never took metromin in 2012-2014  . SCFE (slipped capital femoral epiphysis) 2011    Patient Active Problem List   Diagnosis Date Noted  . Marijuana abuse, continuous 11/19/2019  . Bipolar I disorder with mania (HCC) 05/29/2019  . Delusional disorder (HCC) 11/19/2018  . Psychosis (HCC) 11/18/2018  . Failed hearing screening 05/27/2016  . Failed vision screen 05/27/2016  . Adjustment disorder with depressed mood 09/01/2014  . Aggressive behavior 10/29/2012  . Presence of subdermal contraceptive device 09/22/2012    Past Surgical History:  Procedure Laterality Date  . HIP PINNING  12/2009     OB History   No obstetric history on file.     Family History  Problem Relation Age of Onset  . Obesity Sister   . Obesity Brother   . Hypertension Maternal Grandmother   . Diabetes Maternal Grandmother   . Obesity Maternal Grandmother   . Hypertension Maternal Grandfather   . Obesity Mother   .  Diabetes Maternal Aunt   . Obesity Maternal Aunt   . Diabetes Cousin   . Thyroid disease Neg Hx     Social History   Tobacco Use  . Smoking status: Former Smoker    Types: Cigarettes  . Smokeless tobacco: Never Used  Vaping Use  . Vaping Use: Never used  Substance Use Topics  . Alcohol use: Never    Alcohol/week: 0.0 standard drinks  . Drug use: Not Currently    Types: Marijuana, Cocaine    Comment: pt denies current drug use    Home Medications Prior to Admission medications   Medication Sig Start Date End Date Taking? Authorizing Provider  risperiDONE (RISPERDAL) 1 MG tablet 1 tab po q day and 2 tabs po q hs 11/22/19   Antonieta Pert, MD  risperiDONE microspheres (RISPERDAL CONSTA) 50 MG injection Inject 2 mLs (50 mg total) into the muscle every 14 (fourteen) days. Due on 12/06/19 12/05/19   Antonieta Pert, MD    Allergies    Patient has no known allergies.  Review of Systems   Review of Systems  Unable to perform ROS: Psychiatric disorder    Physical Exam Updated Vital Signs BP (!) 124/107 (BP Location: Left Arm)   Pulse (!) 122   Temp 99.1 F (37.3 C) (Oral)   Resp 18   SpO2 99%   Physical Exam Vitals and nursing note reviewed.  Constitutional:      General: She is not in acute distress.    Appearance: Normal appearance. She is well-developed. She is not toxic-appearing.  HENT:     Head: Normocephalic and atraumatic.  Eyes:     General: Lids are normal.     Conjunctiva/sclera: Conjunctivae normal.     Pupils: Pupils are equal, round, and reactive to light.  Neck:     Thyroid: No thyroid mass.     Trachea: No tracheal deviation.  Cardiovascular:     Rate and Rhythm: Regular rhythm. Tachycardia present.     Heart sounds: Normal heart sounds. No murmur heard. No gallop.   Pulmonary:     Effort: Pulmonary effort is normal. No respiratory distress.     Breath sounds: Normal breath sounds. No stridor. No decreased breath sounds, wheezing, rhonchi  or rales.  Abdominal:     General: Bowel sounds are normal. There is no distension.     Palpations: Abdomen is soft.     Tenderness: There is no abdominal tenderness. There is no rebound.  Musculoskeletal:        General: No tenderness. Normal range of motion.     Cervical back: Normal range of motion and neck supple.  Skin:    General: Skin is warm and dry.     Findings: No abrasion or rash.  Neurological:     Mental Status: She is alert and oriented to person, place, and time.     GCS: GCS eye subscore is 4. GCS verbal subscore is 5. GCS motor subscore is 6.     Cranial Nerves: No cranial nerve deficit.     Sensory: No sensory deficit.  Psychiatric:        Attention and Perception: Attention normal.        Mood and Affect: Affect is angry.        Speech: Speech is rapid and pressured.        Behavior: Behavior is aggressive and hyperactive.        Thought Content: Thought content does not include suicidal ideation. Thought content does not include suicidal plan.     ED Results / Procedures / Treatments   Labs (all labs ordered are listed, but only abnormal results are displayed) Labs Reviewed  RESP PANEL BY RT-PCR (FLU A&B, COVID) ARPGX2  CBC  ACETAMINOPHEN LEVEL  RAPID URINE DRUG SCREEN, HOSP PERFORMED  SALICYLATE LEVEL  ETHANOL  COMPREHENSIVE METABOLIC PANEL  I-STAT BETA HCG BLOOD, ED ([facility], [facility], [facility] ONLY)    EKG   Radiology No results found.  Procedures Procedures   Medications Ordered in ED Medications  acetaminophen (TYLENOL) tablet 650 mg (has no administration in time range)    ED Course  I have reviewed the triage vital signs and the nursing notes.  Pertinent labs & imaging results that were available during my care of the patient were reviewed by me and considered in my medical decision making (see chart for details).    MDM Rules/Calculators/A&P                          Patient medically clear for psychiatric evaluation Final Clinical  Impression(s) / ED Diagnoses Final diagnoses:  None    Rx / DC Orders ED Discharge Orders    None       Lorre Nick, MD 06/12/20 1538
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
## 2020-06-12 NOTE — ED Notes (Signed)
TTS in progress 
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
## 2020-06-13 ENCOUNTER — Inpatient Hospital Stay (HOSPITAL_COMMUNITY)
Admission: AD | Admit: 2020-06-13 | Discharge: 2020-06-18 | DRG: 885 | Disposition: A | Discharge status: Home / Self Care | Payer: Medicaid Other | Source: Intra-hospital | Attending: Psychiatry | Admitting: Psychiatry

## 2020-06-13 ENCOUNTER — Other Ambulatory Visit: Payer: Self-pay

## 2020-06-13 ENCOUNTER — Encounter (HOSPITAL_COMMUNITY): Payer: Self-pay | Admitting: Psychiatry

## 2020-06-13 DIAGNOSIS — F310 Bipolar disorder, current episode hypomanic: Secondary | ICD-10-CM | POA: Diagnosis not present

## 2020-06-13 DIAGNOSIS — E876 Hypokalemia: Secondary | ICD-10-CM | POA: Diagnosis present

## 2020-06-13 DIAGNOSIS — F22 Delusional disorders: Secondary | ICD-10-CM | POA: Diagnosis present

## 2020-06-13 DIAGNOSIS — Z87891 Personal history of nicotine dependence: Secondary | ICD-10-CM

## 2020-06-13 DIAGNOSIS — G4700 Insomnia, unspecified: Secondary | ICD-10-CM | POA: Diagnosis present

## 2020-06-13 DIAGNOSIS — Z20822 Contact with and (suspected) exposure to covid-19: Secondary | ICD-10-CM | POA: Diagnosis present

## 2020-06-13 DIAGNOSIS — F250 Schizoaffective disorder, bipolar type: Secondary | ICD-10-CM | POA: Diagnosis not present

## 2020-06-13 DIAGNOSIS — Z9119 Patient's noncompliance with other medical treatment and regimen: Secondary | ICD-10-CM | POA: Diagnosis not present

## 2020-06-13 DIAGNOSIS — F319 Bipolar disorder, unspecified: Principal | ICD-10-CM | POA: Diagnosis present

## 2020-06-13 MED ORDER — LORAZEPAM 1 MG PO TABS: 2.0000 mg | ORAL_TABLET | Freq: Four times a day (QID) | ORAL | Status: DC | PRN

## 2020-06-13 MED ORDER — OLANZAPINE 10 MG PO TBDP: 10.0000 mg | ORAL_TABLET | Freq: Three times a day (TID) | ORAL | Status: DC | PRN

## 2020-06-13 MED ORDER — TRAZODONE HCL 50 MG PO TABS
50.0000 mg | ORAL_TABLET | Freq: Every evening | ORAL | Status: DC | PRN
  Administered 2020-06-15 (×2): 50 mg via ORAL
  Filled 2020-06-13 (×4): qty 1

## 2020-06-13 MED ORDER — ACETAMINOPHEN 325 MG PO TABS: 650.0000 mg | ORAL_TABLET | Freq: Four times a day (QID) | ORAL | Status: DC | PRN

## 2020-06-13 MED ORDER — ALUM & MAG HYDROXIDE-SIMETH 200-200-20 MG/5ML PO SUSP: 30.0000 mL | ORAL | Status: DC | PRN

## 2020-06-13 MED ORDER — RISPERIDONE 2 MG PO TBDP
2.0000 mg | ORAL_TABLET | Freq: Every day | ORAL | Status: DC
  Filled 2020-06-13 (×3): qty 1

## 2020-06-13 MED ORDER — RISPERIDONE 1 MG PO TBDP: 1.0000 mg | ORAL_TABLET | Freq: Every day | ORAL | Status: DC

## 2020-06-13 MED ORDER — ZIPRASIDONE MESYLATE 20 MG IM SOLR: 20.0000 mg | Freq: Four times a day (QID) | INTRAMUSCULAR | Status: DC | PRN

## 2020-06-13 MED ORDER — HYDROXYZINE HCL 25 MG PO TABS
25.0000 mg | ORAL_TABLET | Freq: Three times a day (TID) | ORAL | Status: DC | PRN
  Administered 2020-06-15 (×2): 25 mg via ORAL
  Filled 2020-06-13 (×4): qty 1

## 2020-06-13 MED ORDER — POTASSIUM CHLORIDE CRYS ER 20 MEQ PO TBCR
40.0000 meq | EXTENDED_RELEASE_TABLET | Freq: Once | ORAL | Status: DC
  Filled 2020-06-13: qty 2

## 2020-06-13 MED ORDER — MAGNESIUM HYDROXIDE 400 MG/5ML PO SUSP: 30.0000 mL | Freq: Every day | ORAL | Status: DC | PRN

## 2020-06-13 MED ORDER — RISPERIDONE 2 MG PO TBDP
3.0000 mg | ORAL_TABLET | Freq: Every day | ORAL | Status: DC
  Filled 2020-06-13 (×2): qty 1

## 2020-06-13 MED ORDER — LORAZEPAM 1 MG PO TABS: 1.0000 mg | ORAL_TABLET | ORAL | Status: DC | PRN

## 2020-06-13 MED ORDER — RISPERIDONE 1 MG PO TABS
1.0000 mg | ORAL_TABLET | Freq: Every day | ORAL | Status: DC
  Administered 2020-06-13: 1 mg via ORAL
  Filled 2020-06-13: qty 1

## 2020-06-13 MED ORDER — RISPERIDONE 2 MG PO TBDP: 2.0000 mg | ORAL_TABLET | Freq: Three times a day (TID) | ORAL | Status: DC | PRN

## 2020-06-13 MED ORDER — RISPERIDONE 2 MG PO TBDP: 2.0000 mg | ORAL_TABLET | Freq: Every day | ORAL | Status: DC

## 2020-06-13 MED ORDER — RISPERIDONE 1 MG PO TABS: 2.0000 mg | ORAL_TABLET | Freq: Every day | ORAL | Status: DC

## 2020-06-13 MED ORDER — ZIPRASIDONE MESYLATE 20 MG IM SOLR: 20.0000 mg | INTRAMUSCULAR | Status: DC | PRN

## 2020-06-13 NOTE — ED Provider Notes (Addendum)
Emergency Medicine Observation Re-evaluation Note  Angie Lopez is a 20 y.o. female, seen on rounds today.  Pt initially presented to the ED for complaints of agitated behavior and delusional thoughts.   Physical Exam  BP 122/72 (BP Location: Left Arm)   Pulse 72   Temp 98.3 F (36.8 C) (Oral)   Resp 18   SpO2 99%  Physical Exam General: alert, nad.  Cardiac: regular rate Lungs: breathing comfortably Psych: alert, content.   ED Course / MDM   I have reviewed the labs performed to date as well as medications administered while in observation.  Recent changes in the last 24 hours include BH reassessment.  Pt is currently not receiving any scheduled BH meds - will restart the risperdal patient was previously on, and ask [facility] team to manage [facility] meds.   Plan  Current plan is for [facility] reassessment - if patient is deemed to need continued bH obs or inpatient [facility] care, then should be moved to [facility] or [facility]      Cathren Laine, MD 06/13/20 [id number]  Pt accepted to [facility] by Dr Jola Babinski.  Pt is alert, content, nad, and appears stable for admission to [facility].    Cathren Laine, MD 06/13/20 [phone]

## 2020-06-13 NOTE — ED Notes (Signed)
Pt refused to take potassium.

## 2020-06-13 NOTE — Discharge Instructions (Addendum)
Transport to [facility] for admission.  Your potassium level is mildly low - eat plenty of fruits and vegetables, and follow up with primary care doctor in 1-2 weeks.

## 2020-06-13 NOTE — ED Notes (Signed)
PT calm, cooperative.  Given toothbrush, toothpaste.  Pt excessively brushed teeth until toothbrush was taken away - she remained calm and cooperative.

## 2020-06-13 NOTE — Progress Notes (Signed)
Pt was accepted to [facility] 500-1   Meets inpatient criteria per  Lorre Nick, MD  The attending physician is Dr. Jola Babinski  Report can be called to [phone]   Pt is IVC Patient can be transport by Law Enforcement  Patient is scheduled to arrive at [facility] at 2:30 pm, RN Juluis Rainier made aware.

## 2020-06-13 NOTE — Progress Notes (Signed)
   06/13/20 2145  Psych Admission Type (Psych Patients Only)  Admission Status Involuntary  Psychosocial Assessment  Patient Complaints None ("I'm not sick")  Eye Contact Glaring  Facial Expression Angry  Affect Angry  Speech Argumentative;Tangential  Interaction Guarded;Hostile;Avoidant  Motor Activity Slow  Appearance/Hygiene Body odor;Disheveled;Poor hygiene  Behavior Characteristics Unwilling to participate;Agressive verbally;Guarded;Irritable;Resistant to care  Mood Suspicious;Irritable  Thought Process  Coherency Circumstantial;Disorganized;Tangential  Content Blaming others;Delusions;Preoccupation;Paranoia  Delusions Paranoid  Perception Derealization  Hallucination None reported or observed  Judgment Poor  Confusion Mild  Danger to Self  Current suicidal ideation? Denies  Danger to Others  Danger to Others None reported or observed  Angie Lopez has been in her room all evening.  She refused to come to groups, allow staff to obtain EKG or take her hs medication.  "I am not sick and I don't need medications, bye."  "I know you guys cannot force medication because I read the bill of rights."  Explained that taking the medications will help you get discharged sooner.  She stated "I don't care, I can stay here for years."  She was upset that she didn't have tampons or underwear.  Provided her pads and mesh underwear.   Q 15 minute checks maintained for safety.

## 2020-06-13 NOTE — Tx Team (Signed)
Initial Treatment Plan 06/13/2020 5:12 PM Angie Lopez LYY:[id number]    PATIENT STRESSORS: Financial difficulties Marital or family conflict Medication change or noncompliance Substance abuse   PATIENT STRENGTHS: General fund of knowledge Physical Health   PATIENT IDENTIFIED PROBLEMS: Psychosis  Substance abuse  Agitation    "Nothing"             DISCHARGE CRITERIA:  Improved stabilization in mood, thinking, and/or behavior Need for constant or close observation no longer present Reduction of life-threatening or endangering symptoms to within safe limits Verbal commitment to aftercare and medication compliance  PRELIMINARY DISCHARGE PLAN: Outpatient therapy Medication management  PATIENT/FAMILY INVOLVEMENT: This treatment plan has been presented to and reviewed with the patient, Angie Lopez.  The patient and family have been given the opportunity to ask questions and make suggestions.  Levin Bacon, RN 06/13/2020, 5:12 PM
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
## 2020-06-14 DIAGNOSIS — F250 Schizoaffective disorder, bipolar type: Secondary | ICD-10-CM | POA: Diagnosis not present

## 2020-06-14 MED ORDER — HALOPERIDOL 5 MG PO TABS: 10.0000 mg | ORAL_TABLET | Freq: Four times a day (QID) | ORAL | Status: DC | PRN

## 2020-06-14 MED ORDER — BENZTROPINE MESYLATE 1 MG PO TABS
1.0000 mg | ORAL_TABLET | Freq: Two times a day (BID) | ORAL | Status: DC | PRN
  Filled 2020-06-14: qty 1

## 2020-06-14 MED ORDER — POTASSIUM CHLORIDE CRYS ER 20 MEQ PO TBCR
20.0000 meq | EXTENDED_RELEASE_TABLET | Freq: Two times a day (BID) | ORAL | Status: AC
  Filled 2020-06-14 (×3): qty 1

## 2020-06-14 MED ORDER — HALOPERIDOL 5 MG PO TABS
10.0000 mg | ORAL_TABLET | Freq: Two times a day (BID) | ORAL | Status: DC
  Administered 2020-06-14: 10 mg via ORAL
  Filled 2020-06-14 (×5): qty 2

## 2020-06-14 MED ORDER — BENZTROPINE MESYLATE 1 MG/ML IJ SOLN
1.0000 mg | Freq: Two times a day (BID) | INTRAMUSCULAR | Status: DC | PRN
  Administered 2020-06-17: 1 mg via INTRAMUSCULAR
  Filled 2020-06-14 (×2): qty 2

## 2020-06-14 MED ORDER — HALOPERIDOL LACTATE 5 MG/ML IJ SOLN
10.0000 mg | Freq: Two times a day (BID) | INTRAMUSCULAR | Status: DC
  Administered 2020-06-14 – 2020-06-15 (×2): 10 mg via INTRAMUSCULAR
  Filled 2020-06-14 (×5): qty 2

## 2020-06-14 MED ORDER — HALOPERIDOL LACTATE 5 MG/ML IJ SOLN
10.0000 mg | Freq: Four times a day (QID) | INTRAMUSCULAR | Status: DC | PRN
Start: 2020-06-14 — End: 2020-06-18

## 2020-06-14 NOTE — Progress Notes (Signed)
   06/14/20 2300  Psych Admission Type (Psych Patients Only)  Admission Status Involuntary  Psychosocial Assessment  Patient Complaints None  Eye Contact Brief  Facial Expression Blank  Affect Appropriate to circumstance  Speech Logical/coherent  Interaction Defensive;Minimal;Guarded  Motor Activity Slow  Appearance/Hygiene Disheveled  Behavior Characteristics Guarded  Mood Suspicious  Thought Process  Coherency WDL  Content Ambivalence;Preoccupation  Delusions WDL  Perception WDL  Hallucination None reported or observed  Judgment Poor  Confusion Mild  Danger to Self  Current suicidal ideation? Denies  Danger to Others  Danger to Others None reported or observed
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
## 2020-06-14 NOTE — [facility] Group Notes (Signed)
Occupational Therapy Group Note Date: 06/14/2020 Group Topic/Focus: Sensory Modulation  Group Description: Group encouraged increased participation and engagement through discussion focused on sensory modulation and self-soothing through use of the 8 senses. Discussion introduced the concept of sensory modulation and integration, focusing on how we can utilize our body and it's senses to self-soothe or cope, when we are experiencing an over or under-whelming sensation or feeling. Group members were introduced to a sensory diet checklist as a helpful tool/resource that can be utilized to identify what activities and strategies we prefer and do not prefer based upon our response to different stimulus. The concept of alerting vs calming activities was also introduced to understand how to counteract how we are feeling (Example: when we are feeling overwhelmed/stressed, engage in something calming. When we are feeling depressed/low energy, engage in something alerting). Group members engaged actively in discussion sharing their own personal sensory likes/dislikes.   Participation Level: Patient did not attend OT group session despite personal invitation from [facility]. Remained in bedroom during group.   Plan: Continue to engage patient in OT groups 2 - 3x/week.  06/14/2020  Lucile Hillmann, MOT, OTR/L 

## 2020-06-14 NOTE — Progress Notes (Signed)
Recreation Therapy Notes  Date: 4.7.22 Time: 1000 Location: [address] Dayroom  Group Topic: Anger Management  Goal Area(s) Addresses:  Patient will identify triggers for anger.  Patient will identify a situation that makes them angry.  Patient will identify what other emotions come with anger.   Intervention: Conversation with group, and worksheets  Activity: Patient discussed anger and what makes them angry.  Patients were given an anger thermometer.  Patients were to rank 10 things on the thermometer that make them angry.  Patients were to rank them 1-10.  One being the least angry and ten being the most angry.    Education: Anger Management, Discharge Planning   Education Outcome: Acknowledges education/In group clarification offered/Needs additional education.   Clinical Observations/Feedback: Pt did not attend group session.     Caroll Rancher, LRT/CTRS         Caroll Rancher A 06/14/2020 11:24 AM
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
## 2020-06-14 NOTE — Progress Notes (Signed)
D: Patient denies SI/HI/AH. Pt. Was sitting on the floor in her room reading  The Bible and kept waiving this writer off saying " bye, you're interupting me"  A:  Patient took scheduled medicine.  Support and encouragement provided Routine safety checks conducted every 15 minutes. Patient  Informed to notify staff with any concerns.   R: Safety maintained.
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
## 2020-06-15 MED ORDER — HALOPERIDOL 5 MG PO TABS
5.0000 mg | ORAL_TABLET | Freq: Every day | ORAL | Status: DC
  Filled 2020-06-15 (×6): qty 1

## 2020-06-15 MED ORDER — HALOPERIDOL 5 MG PO TABS
10.0000 mg | ORAL_TABLET | Freq: Every day | ORAL | Status: DC
  Filled 2020-06-15 (×4): qty 2

## 2020-06-15 MED ORDER — HALOPERIDOL LACTATE 5 MG/ML IJ SOLN
10.0000 mg | Freq: Every day | INTRAMUSCULAR | Status: DC
  Administered 2020-06-16 – 2020-06-17 (×2): 10 mg via INTRAMUSCULAR
  Filled 2020-06-15 (×4): qty 2

## 2020-06-15 MED ORDER — POTASSIUM CHLORIDE CRYS ER 20 MEQ PO TBCR
20.0000 meq | EXTENDED_RELEASE_TABLET | Freq: Two times a day (BID) | ORAL | Status: DC
  Filled 2020-06-15 (×2): qty 1

## 2020-06-15 MED ORDER — HALOPERIDOL LACTATE 5 MG/ML IJ SOLN
5.0000 mg | Freq: Every day | INTRAMUSCULAR | Status: DC
  Administered 2020-06-15 – 2020-06-17 (×3): 5 mg via INTRAMUSCULAR
  Filled 2020-06-15 (×7): qty 1

## 2020-06-15 NOTE — [facility] Group Notes (Signed)
[facility] LCSW Group Therapy  06/15/2020 11:46 AM  Type of Therapy:  Group Therapy: Strengths Exploration   Participation Level:  Did Not Attend   Summary of Progress/Problems: Patient did not attend.  Donice Alperin A Darold Miley 06/15/2020, 11:46 AM

## 2020-06-15 NOTE — Progress Notes (Signed)
Progress note    06/15/20 0805  Psych Admission Type (Psych Patients Only)  Admission Status Involuntary  Psychosocial Assessment  Patient Complaints Agitation;Anger;Anxiety  Eye Contact Brief;Watchful  Facial Expression Angry;Anxious  Affect Angry;Anxious;Apprehensive;Labile  Speech Aggressive;Argumentative;Logical/coherent  Interaction Arrogant;Attention-seeking;Defensive;Dominating;Hostile  Motor Activity Slow  Appearance/Hygiene Disheveled  Behavior Characteristics Unwilling to participate;Agressive verbally;Agitated;Anxious;Irritable  Mood Labile;Suspicious;Irritable;Preoccupied  Thought Radio producer  Content Ambivalence;Preoccupation  Delusions Controlled;Paranoid;Persecutory  Perception Hallucinations  Hallucination Auditory  Judgment Limited  Confusion Mild  Danger to Self  Current suicidal ideation? Denies  Danger to Others  Danger to Others None reported or observed
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
## 2020-06-15 NOTE — Progress Notes (Signed)
   06/15/20 0500  Sleep  Number of Hours 6.5

## 2020-06-15 NOTE — [facility] Counselor (Signed)
CSW attempted PSA, however pt is to acute at this time. CSW will attempt to complete PSA at a later time.    Ruthann Cancer MSW, LCSW Clincal Social Worker  [facility]

## 2020-06-15 NOTE — Plan of Care (Signed)
  Problem: Education: Goal: Knowledge of [facility] Education information/materials will improve Outcome: Progressing Goal: Emotional status will improve Outcome: Progressing Goal: Mental status will improve Outcome: Progressing Goal: Verbalization of understanding the information provided will improve Outcome: Progressing   

## 2020-06-15 NOTE — [facility] Suicide Risk Assessment (Signed)
[facility] INPATIENT:  Family/Significant Other Suicide Prevention Education   Refusal of Consents:  Suicide Prevention Education:  Patient Refusal for Family/Significant Other Suicide Prevention Education: The patient Angie Lopez has refused to provide written consent for family/significant other to be provided Family/Significant Other Suicide Prevention Education during admission and/or prior to discharge.  Physician notified.   SPE completed with patient, as patient refused to consent to family contact. SPI pamphlet provided to pt and pt was encouraged to share information with support network, ask questions, and talk about any concerns relating to SPE. Patient denies access to guns/firearms and verbalized understanding of information provided. Mobile Crisis information also provided to patient.   Ruthann Cancer MSW, LCSW Clincal Social Worker  [facility]
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
## 2020-06-15 NOTE — Progress Notes (Signed)
Pt has been isolative all evening, pt did not attend the wrap up group. Pt stated her day was good, reported good appetite, but poor sleep. Vistaril and trazodone given as ordered. Denied SI/HI and contracted for safety, will continue to monitor.

## 2020-06-15 NOTE — Progress Notes (Signed)
Recreation Therapy Notes  Date: 4.8.22 Time: 1000 Location: 500 Hall Dayroom   Group Topic: Communication, Team Building, Problem Solving  Goal Area(s) Addresses:  Patient will effectively work with peer towards shared goal.  Patient will identify skills used to make activity successful.  Patient will share challenges and verbalize solution-driven approaches used. Patient will identify how skills used during activity can be used to reach post d/c goals.   Intervention: STEM Activity   Activity: [employer]. Patients were provided the following materials: 5 drinking straws, 5 rubber bands, 5 paper clips, 2 index cards and 2 drinking cups. Using the provided materials patients were asked to build a launching mechanism to launch a ping pong ball across the room, approximately 10 feet. Patients were divided into teams of 3-5. Instructions required all materials be incorporated into the device, functionality of items left to the peer group's discretion.  Education: Pharmacist, community, Scientist, physiological, Air cabin crew, Building control surveyor.   Education Outcome: Acknowledges education/In group clarification offered/Needs additional education.   Clinical Observations/Feedback:  Pt did not attend group session.    Caroll Rancher, LRT/CTRS         Caroll Rancher A 06/15/2020 11:23 AM

## 2020-06-15 NOTE — Tx Team (Signed)
Interdisciplinary Treatment and Diagnostic Plan Update  06/15/2020 Time of Session: 9:45am Angie Lopez MRN: [id number]  Principal Diagnosis: <principal problem not specified>  Secondary Diagnoses: Active Problems:   Bipolar 1 disorder (HCC)   Current Medications:  Current Facility-Administered Medications  Medication Dose Route Frequency Provider Last Rate Last Admin  . acetaminophen (TYLENOL) tablet 650 mg  650 mg Oral Q6H PRN Sharma Covert, MD      . alum & mag hydroxide-simeth (MAALOX/MYLANTA) 200-200-20 MG/5ML suspension 30 mL  30 mL Oral Q4H PRN Sharma Covert, MD      . benztropine (COGENTIN) tablet 1 mg  1 mg Oral BID PRN Sharma Covert, MD       Or  . benztropine mesylate (COGENTIN) injection 1 mg  1 mg Intramuscular BID PRN Sharma Covert, MD      . haloperidol (HALDOL) tablet 10 mg  10 mg Oral Q6H PRN Sharma Covert, MD       Or  . haloperidol lactate (HALDOL) injection 10 mg  10 mg Intramuscular Q6H PRN Sharma Covert, MD      . Derrill Memo ON 06/16/2020] haloperidol (HALDOL) tablet 10 mg  10 mg Oral QHS Sharma Covert, MD       Or  . Derrill Memo ON 06/16/2020] haloperidol lactate (HALDOL) injection 10 mg  10 mg Intramuscular QHS Sharma Covert, MD      . haloperidol (HALDOL) tablet 5 mg  5 mg Oral Daily Sharma Covert, MD       Or  . haloperidol lactate (HALDOL) injection 5 mg  5 mg Intramuscular Daily Sharma Covert, MD   5 mg at 06/15/20 1428  . hydrOXYzine (ATARAX/VISTARIL) tablet 25 mg  25 mg Oral TID PRN Sharma Covert, MD   25 mg at 06/15/20 0216  . magnesium hydroxide (MILK OF MAGNESIA) suspension 30 mL  30 mL Oral Daily PRN Sharma Covert, MD      . potassium chloride SA (KLOR-CON) CR tablet 20 mEq  20 mEq Oral BID Sharma Covert, MD      . potassium chloride SA (KLOR-CON) CR tablet 20 mEq  20 mEq Oral BID Sharma Covert, MD      . traZODone (DESYREL) tablet 50 mg  50 mg Oral QHS PRN Sharma Covert, MD   50 mg at  06/15/20 0216   PTA Medications: Medications Prior to Admission  Medication Sig Dispense Refill Last Dose  . risperiDONE (RISPERDAL) 1 MG tablet 1 tab po q day and 2 tabs po q hs (Patient not taking: No sig reported) 90 tablet 0 Not Taking at Unknown time  . risperiDONE microspheres (RISPERDAL CONSTA) 50 MG injection Inject 2 mLs (50 mg total) into the muscle every 14 (fourteen) days. Due on 12/06/19 (Patient not taking: Reported on 06/14/2020) 1 each 0 Not Taking at Unknown time    Patient Stressors: Financial difficulties Marital or family conflict Medication change or noncompliance Substance abuse  Patient Strengths: General fund of knowledge Physical Health  Treatment Modalities: Medication Management, Group therapy, Case management,  1 to 1 session with clinician, Psychoeducation, Recreational therapy.   Physician Treatment Plan for Primary Diagnosis: <principal problem not specified> Long Term Goal(s): Improvement in symptoms so as ready for discharge Improvement in symptoms so as ready for discharge   Short Term Goals: Ability to identify changes in lifestyle to reduce recurrence of condition will improve Ability to verbalize feelings will improve Ability to disclose and discuss suicidal ideas  Ability to demonstrate self-control will improve Ability to identify and develop effective coping behaviors will improve Ability to maintain clinical measurements within normal limits will improve Compliance with prescribed medications will improve Ability to identify triggers associated with substance abuse/mental health issues will improve Ability to identify changes in lifestyle to reduce recurrence of condition will improve Ability to verbalize feelings will improve Ability to disclose and discuss suicidal ideas Ability to demonstrate self-control will improve Ability to identify and develop effective coping behaviors will improve Ability to maintain clinical measurements within  normal limits will improve Compliance with prescribed medications will improve Ability to identify triggers associated with substance abuse/mental health issues will improve  Medication Management: Evaluate patient's response, side effects, and tolerance of medication regimen.  Therapeutic Interventions: 1 to 1 sessions, Unit Group sessions and Medication administration.  Evaluation of Outcomes: Not Met  Physician Treatment Plan for Secondary Diagnosis: Active Problems:   Bipolar 1 disorder ([place])  Long Term Goal(s): Improvement in symptoms so as ready for discharge Improvement in symptoms so as ready for discharge   Short Term Goals: Ability to identify changes in lifestyle to reduce recurrence of condition will improve Ability to verbalize feelings will improve Ability to disclose and discuss suicidal ideas Ability to demonstrate self-control will improve Ability to identify and develop effective coping behaviors will improve Ability to maintain clinical measurements within normal limits will improve Compliance with prescribed medications will improve Ability to identify triggers associated with substance abuse/mental health issues will improve Ability to identify changes in lifestyle to reduce recurrence of condition will improve Ability to verbalize feelings will improve Ability to disclose and discuss suicidal ideas Ability to demonstrate self-control will improve Ability to identify and develop effective coping behaviors will improve Ability to maintain clinical measurements within normal limits will improve Compliance with prescribed medications will improve Ability to identify triggers associated with substance abuse/mental health issues will improve     Medication Management: Evaluate patient's response, side effects, and tolerance of medication regimen.  Therapeutic Interventions: 1 to 1 sessions, Unit Group sessions and Medication administration.  Evaluation of  Outcomes: Not Met   RN Treatment Plan for Primary Diagnosis: <principal problem not specified> Long Term Goal(s): Knowledge of disease and therapeutic regimen to maintain health will improve  Short Term Goals: Ability to participate in decision making will improve, Ability to verbalize feelings will improve and Ability to identify and develop effective coping behaviors will improve  Medication Management: RN will administer medications as ordered by provider, will assess and evaluate patient's response and provide education to patient for prescribed medication. RN will report any adverse and/or side effects to prescribing provider.  Therapeutic Interventions: 1 on 1 counseling sessions, Psychoeducation, Medication administration, Evaluate responses to treatment, Monitor vital signs and CBGs as ordered, Perform/monitor CIWA, COWS, AIMS and Fall Risk screenings as ordered, Perform wound care treatments as ordered.  Evaluation of Outcomes: Not Met   LCSW Treatment Plan for Primary Diagnosis: <principal problem not specified> Long Term Goal(s): Safe transition to appropriate next level of care at discharge, Engage patient in therapeutic group addressing interpersonal concerns.  Short Term Goals: Engage patient in aftercare planning with referrals and resources, Increase social support and Increase ability to appropriately verbalize feelings  Therapeutic Interventions: Assess for all discharge needs, 1 to 1 time with Social worker, Explore available resources and support systems, Assess for adequacy in community support network, Educate family and significant other(s) on suicide prevention, Complete Psychosocial Assessment, Interpersonal group therapy.  Evaluation of Outcomes:  Not Met   Progress in Treatment: Attending groups: No. Participating in groups: No. Taking medication as prescribed: Yes. Toleration medication: Yes. Family/Significant other contact made: No, will contact:  declined  consents Patient understands diagnosis: No. Discussing patient identified problems/goals with staff: No. Medical problems stabilized or resolved: Yes. Denies suicidal/homicidal ideation: Yes. Issues/concerns per patient self-inventory: No. Other: None  New problem(s) identified: No, Describe:  None  New Short Term/Long Term Goal(s): medication stabilization, elimination of SI thoughts, development of comprehensive mental wellness plan.  Patient Goals:  Pt declined to participate  Discharge Plan or Barriers: Patient recently admitted. CSW will continue to follow and assess for appropriate referrals and possible discharge planning.  Reason for Continuation of Hospitalization: Delusions  Hallucinations Medication stabilization  Estimated Length of Stay: 3-5 days  Attendees: Patient: 06/15/2020   Physician: Dr. Myles Lipps 06/15/2020   Nursing:  06/15/2020   RN Care Manager: 06/15/2020   Social Worker: Toney Reil, [place]  06/15/2020   Recreational Therapist:  06/15/2020   Other:  06/15/2020   Other:  06/15/2020   Other: 06/15/2020      Scribe for Treatment Team: Mliss Fritz, Latanya Presser 06/15/2020 2:44 PM

## 2020-06-16 MED ORDER — HALOPERIDOL DECANOATE 100 MG/ML IM SOLN
50.0000 mg | Freq: Once | INTRAMUSCULAR | Status: AC
  Administered 2020-06-16: 50 mg via INTRAMUSCULAR
  Filled 2020-06-16: qty 0.5

## 2020-06-16 NOTE — [facility] Group Notes (Signed)
[facility] Group Notes: (Clinical Social Work)   06/16/2020      Type of Therapy:  Group Therapy   Participation Level:  Did Not Attend - was invited both individually by MHT and by overhead announcement, chose not to attend.   Ambrose Mantle, LCSW 06/16/2020, 3:44 PM

## 2020-06-16 NOTE — Progress Notes (Signed)
   06/16/20 0500  Sleep  Number of Hours 7.5

## 2020-06-16 NOTE — [facility] Group Notes (Signed)
.  Psychoeducational Group Note  Date: 06/16/2020 Time: 0900-1000    Goal Setting   Purpose of Group: This group helps to provide patients with the steps of setting a goal that is specific, measurable, attainable, realistic and time specific. A discussion on how we keep ourselves stuck with negative self talk.    Participation Level:  Did not attend   Tarry Fountain A  

## 2020-06-16 NOTE — Progress Notes (Signed)
   06/16/20 0900  Psych Admission Type (Psych Patients Only)  Admission Status Involuntary  Psychosocial Assessment  Patient Complaints Irritability  Eye Contact Brief;Watchful  Facial Expression Angry;Anxious  Affect Angry;Anxious;Apprehensive;Labile  Speech Aggressive;Argumentative;Logical/coherent  Interaction Arrogant;Attention-seeking;Defensive;Dominating;Hostile  Motor Activity Slow  Appearance/Hygiene Disheveled  Behavior Characteristics Unwilling to participate  Mood Labile  Thought Process  Coherency Blocking  Content Ambivalence;Preoccupation  Delusions Controlled;Paranoid;Persecutory  Perception Hallucinations  Hallucination Auditory  Judgment Limited  Confusion Mild  Danger to Self  Current suicidal ideation? Denies  Danger to Others  Danger to Others None reported or observed
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
## 2020-06-16 NOTE — Progress Notes (Signed)
Pt did not attend orientation group.  

## 2020-06-16 NOTE — Progress Notes (Signed)
Adult Psychoeducational Group Note  Date:  06/16/2020 Time:  8:25 PM  Group Topic/Focus:  Wrap-Up Group:   The focus of this group is to help patients review their daily goal of treatment and discuss progress on daily workbooks.  Participation Level:  Did Not Attend    Delos Haring 06/16/2020, 8:25 PM

## 2020-06-16 NOTE — Progress Notes (Signed)
Pt is isolative to her room. Pt denies any trouble sleeping at night and reports a good appetite. Pt is minimal and guarded during her assessment. She has elective mutism at times. Pt was offered her bedtime medications by mouth, but she refused. She said that she would only take the haldol IM. Pt has been encouraged to participate in the milieu and in groups, but she refuses to. Pt is irritable. Pt denies SI/HI. Active listening, reassurance, and support provided. Q 15 min safety checks continue. Pt's safety has been maintained.   06/16/20 2125  Psych Admission Type (Psych Patients Only)  Admission Status Involuntary  Psychosocial Assessment  Patient Complaints Irritability;Isolation  Eye Contact Brief;Watchful  Facial Expression Anxious;Pensive;Angry  Affect Irritable;Anxious;Labile  Speech Argumentative;Logical/coherent;Elective mutism  Interaction Arrogant;Avoidant;Defensive;Hostile;Forwards little;Guarded;Isolative  Motor Activity Slow  Appearance/Hygiene Disheveled  Behavior Characteristics Unwilling to participate;Anxious;Guarded;Irritable  Mood Anxious;Labile;Preoccupied;Irritable  Thought Process  Coherency Blocking  Content Preoccupation;Ambivalence;Paranoia  Delusions Controlled;Paranoid;Persecutory  Perception Hallucinations  Hallucination Auditory  Judgment Limited  Confusion Mild  Danger to Self  Current suicidal ideation? Denies  Danger to Others  Danger to Others None reported or observed

## 2020-06-17 NOTE — Plan of Care (Signed)
  Problem: Coping: Goal: Ability to demonstrate self-control will improve Outcome: Progressing   Problem: Health Behavior/Discharge Planning: Goal: Identification of resources available to assist in meeting health care needs will improve Outcome: Progressing Goal: Compliance with treatment plan for underlying cause of condition will improve Outcome: Progressing   

## 2020-06-17 NOTE — [facility] Group Notes (Signed)
Adult Psychoeducational Group Not Date:  06/17/2020 Time:  0900-1045 Group Topic/Focus: PROGRESSIVE RELAXATION. A group where deep breathing is taught and tensing and relaxation muscle groups is used. Imagery is used as well.  Pts are asked to imagine 3 pillars that hold them up when they are not able to hold themselves up.  Participation Level:  Pt did not attend   Dione Housekeeper

## 2020-06-17 NOTE — Progress Notes (Signed)
Adult Psychoeducational Group Note  Date:  06/17/2020 Time:  9:42 PM  Group Topic/Focus:  Wrap-Up Group:   The focus of this group is to help patients review their daily goal of treatment and discuss progress on daily workbooks.  Participation Level:  Did Not Attend  Participation Quality:  Did Not Attend  Affect:  Did Not Attend  Cognitive:  Did Not Attend  Insight: None  Engagement in Group:  Did Not Attend  Modes of Intervention:  Did Not Attend  Additional Comments:  Pt did not attend evening wrap up group tonight.  Felipa Furnace 06/17/2020, 9:42 PM

## 2020-06-17 NOTE — [facility] Group Notes (Signed)
[facility] LCSW Group Therapy Note  Date/Time:  06/17/2020  11:00AM-12:00PM  Type of Therapy and Topic:  Group Therapy:  Music and Mood  Participation Level:  Did Not Attend   Description of Group: In this process group, members listened to a variety of genres of music and identified that different types of music evoke different responses.  Patients were encouraged to identify music that was soothing for them and music that was energizing for them.  Patients discussed how this knowledge can help with wellness and recovery in various ways including managing depression and anxiety as well as encouraging healthy sleep habits.    Therapeutic Goals: Patients will explore the impact of different varieties of music on mood Patients will verbalize the thoughts they have when listening to different types of music Patients will identify music that is soothing to them as well as music that is energizing to them Patients will discuss how to use this knowledge to assist in maintaining wellness and recovery Patients will explore the use of music as a coping skill  Summary of Patient Progress:  N/A  Therapeutic Modalities: Solution Focused Brief Therapy Activity   Morelia Cassells Grossman-Orr, LCSW    

## 2020-06-17 NOTE — [facility] Group Notes (Signed)
Psychoeducational Group Note  Date: 06-17-20 Time:  1300  Group Topic/Focus:  Making Healthy Choices:   The focus of this group is to help patients identify negative/unhealthy choices they were using prior to admission and identify positive/healthier coping strategies to replace them upon discharge.In this group, patients started asking about the brain and how the brain works with and how the chemicals work for those who use substances, the pros and cons of saboxone.  Participation Level:  Pt did not attend  PJudge, Delorse Limber
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
## 2020-06-17 NOTE — Progress Notes (Signed)
Progress note    06/17/20 1012  Psych Admission Type (Psych Patients Only)  Admission Status Involuntary  Psychosocial Assessment  Patient Complaints Anger;Anxiety  Eye Contact Fair;Watchful  Facial Expression Anxious;Pensive  Affect Anxious  Speech Logical/coherent  Interaction Cautious;Forwards little;Guarded  Motor Activity Slow  Appearance/Hygiene Body odor;Disheveled;Poor hygiene  Behavior Characteristics Cooperative;Anxious;Guarded  Mood Anxious;Ambivalent;Pleasant  Thought Process  Coherency Blocking  Content Ambivalence;Paranoia  Delusions Controlled;Paranoid;Persecutory  Perception Hallucinations  Hallucination Auditory  Judgment Limited  Confusion None  Danger to Self  Current suicidal ideation? Denies  Danger to Others  Danger to Others None reported or observed

## 2020-06-18 DIAGNOSIS — F319 Bipolar disorder, unspecified: Principal | ICD-10-CM

## 2020-06-18 MED ORDER — HALOPERIDOL 5 MG PO TABS: 5.0000 mg | ORAL_TABLET | Freq: Every day | ORAL | 0 refills | Status: DC

## 2020-06-18 MED ORDER — BENZTROPINE MESYLATE 1 MG PO TABS: 1.0000 mg | ORAL_TABLET | Freq: Two times a day (BID) | ORAL | 0 refills | Status: DC | PRN

## 2020-06-18 MED ORDER — HALOPERIDOL 10 MG PO TABS: 10.0000 mg | ORAL_TABLET | Freq: Every day | ORAL | 0 refills | Status: DC

## 2020-06-18 NOTE — [facility] Suicide Risk Assessment (Signed)
[facility] Discharge Suicide Risk Assessment   Principal Problem: <principal problem not specified> Discharge Diagnoses: Active Problems:   Bipolar 1 disorder (HCC)   Total Time spent with patient: 20 minutes  Musculoskeletal: Strength & Muscle Tone: within normal limits Gait & Station: normal Patient leans: N/A  Psychiatric Specialty Exam: Review of Systems  All other systems reviewed and are negative.   Blood pressure (!) 135/116, pulse 91, temperature 98 F (36.7 C), temperature source Oral, resp. rate 18, height 5\' 9"  (1.753 m), weight 73.9 kg, SpO2 100 %.Body mass index is 24.07 kg/m.  General Appearance: Fairly Groomed  ::  Fair  Speech:  Normal [id number]  Volume:  Normal  Mood:  Euthymic  Affect:  Congruent  Thought Process:  Coherent and Descriptions of Associations: Loose  Orientation:  Full (Time, Place, and Person)  Thought Content:  Delusions and Paranoid Ideation  Suicidal Thoughts:  No  Homicidal Thoughts:  No  Memory:  Immediate;   Fair Recent;   Fair Remote;   Fair  Judgement:  Intact  Insight:  Lacking  Psychomotor Activity:  Normal  Concentration:  Fair  Recall:  [id number] of Knowledge:Fair  Language: Fair  Akathisia:  Negative  Handed:  Right  AIMS (if indicated):     Assets:  Desire for Improvement Resilience  Sleep:  Number of Hours: 7.5  Cognition: WNL  ADL's:  Intact   Mental Status Per Nursing Assessment::   On Admission:  NA  Demographic Factors:  Adolescent or young adult, Low socioeconomic status and Unemployed  Loss Factors: NA  Historical Factors: Impulsivity  Risk Reduction Factors:   Living with another person, especially a relative  Continued Clinical Symptoms:  Schizophrenia:   Less than 63 years old Paranoid or undifferentiated type  Cognitive Features That Contribute To Risk:  None    Suicide Risk:  Minimal: No identifiable suicidal ideation.  Patients presenting with no risk factors but with morbid  ruminations; may be classified as minimal risk based on the severity of the depressive symptoms   Follow-up Information    [facility]. Go on 06/25/2020.   Specialty: Behavioral Health Why: You have a walk in appointment for therapy services on 06/25/20 at 7:45 am.  You also have a walk in appointment for medication management on 07/18/20 at 7:45 am.  Walk in appointments are first come, first served and are held in person.  Contact information: [phone] [place] [phone]              Plan Of Care/Follow-up recommendations:  Activity:  ad lib  [phone], MD 06/18/2020, 7:55 AM

## 2020-06-18 NOTE — Plan of Care (Signed)
Patient did not attend recreation therapy group sessions.    Gina Leblond, LRT/CTRS 

## 2020-06-18 NOTE — [facility] Group Notes (Signed)
LCSW Group Therapy Note  Type of Therapy/Topic: Group Therapy: Six Dimensions of Wellness  Participation Level: Did Not Attend  Description of Group: This group will address the concept of wellness and the six concepts of wellness: occupational, physical, social, intellectual, spiritual, and emotional. Patients will be encouraged to process areas in their lives that are out of balance and identify reasons for remaining unbalanced. Patients will be encouraged to explore ways to practice healthy habits daily to attain better physical and mental health outcomes.  Therapeutic Goals: 1. Identify aspects of wellness that they are doing well. 2. Identify aspects of wellness that they would like to improve upon. 3. Identify one action they can take to improve an aspect of wellness in their lives.   Summary of Patient Progress: Did not attend.   Therapeutic Modalities: Cognitive Behavioral Therapy Solution-Focused Therapy Relapse Prevention   Ruthann Cancer MSW, LCSW Clincal Social Worker  [facility]

## 2020-06-18 NOTE — [facility] Counselor (Signed)
CSW spoke with this patients mother, [name] [phone]) who states that this patient  Is able to return home and states she will be able to pick this patient up today at discharge.    Ruthann Cancer MSW, LCSW Clincal Social Worker  [facility]

## 2020-06-18 NOTE — Progress Notes (Signed)
Pt did not attend orientation group.  
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
## 2020-06-18 NOTE — Plan of Care (Signed)
Patient is alert and oriented and expressing readiness for discharge. Reported that she prefers IM medications and received Haldol 5 mg, left gluteus and tolerated well. Denies SI/HI.

## 2020-06-18 NOTE — Progress Notes (Signed)
  [facility] Adult Case Management Discharge Plan :  Will you be returning to the same living situation after discharge:  Yes,  to home with mother At discharge, do you have transportation home?: Yes,  mother to pick this patient up Do you have the ability to pay for your medications: Yes,  has insurance   Release of information consent forms completed and in the chart;  Patient's signature needed at discharge.  Patient to Follow up at:  Follow-up Information    [facility]. Go on 06/25/2020.   Specialty: Behavioral Health Why: You have a walk in appointment for therapy services on 06/25/20 at 7:45 am.  You also have a walk in appointment for medication management on 07/18/20 at 7:45 am.  Walk in appointments are first come, first served and are held in person.  Contact information: [address] [place] [address] [phone]              Next level of care provider has access to [facility] Link:yes  Safety Planning and Suicide Prevention discussed: Yes,  with patient  Have you used any form of tobacco in the last 30 days? (Cigarettes, Smokeless Tobacco, Cigars, and/or Pipes): No  Has patient been referred to the Quitline?: N/A patient is not a smoker  Patient has been referred for addiction treatment: N/A  Otelia Santee, LCSW 06/18/2020, 9:36 AM

## 2020-06-18 NOTE — Progress Notes (Signed)
D: Patient presents with blunted affect at time of assessment. Patient is medication compliant at this time. Patient denies SI/HI at this time. Patient denies AH/VH at this time but appears to be responding to internal stimuli. Patient contracts for safety.  A: Provided positive reinforcement and encouragement.  R: Patient cooperative and receptive to efforts. Patient remain safe on the unit.   06/17/20 2036  Psych Admission Type (Psych Patients Only)  Admission Status Involuntary  Psychosocial Assessment  Patient Complaints Anger;Suspiciousness  Eye Contact Watchful;Fair  Facial Expression Flat;Pensive  Affect Blunted  Speech Logical/coherent  Interaction Cautious;Guarded;Minimal  Motor Activity Slow  Appearance/Hygiene Body odor;Disheveled;Poor hygiene  Behavior Characteristics Cooperative;Guarded  Mood Pleasant  Thought Process  Coherency Blocking  Content Ambivalence;Paranoia  Delusions Persecutory;Paranoid  Perception Hallucinations  Hallucination Auditory  Judgment Limited  Confusion None  Danger to Self  Current suicidal ideation? Denies  Danger to Others  Danger to Others None reported or observed

## 2020-06-18 NOTE — Progress Notes (Signed)
Recreation Therapy Notes  INPATIENT RECREATION TR PLAN  Patient Details Name: Oriana Horiuchi MRN: [id number] DOB: 03/29/2000 Today's Date: 06/18/2020  Rec Therapy Plan Is patient appropriate for Therapeutic Recreation?: Yes Treatment times per week: about 3 days Estimated Length of Stay: 5-7 days TR Treatment/Interventions: Group participation (Comment)  Discharge Criteria Pt will be discharged from therapy if:: Discharged Treatment plan/goals/alternatives discussed and agreed upon by:: Patient/family  Discharge Summary Short term goals set: See patient care plan Short term goals met: Not met Reason goals not met: Patient did not attend group sesions. Therapeutic equipment acquired: N/A Reason patient discharged from therapy: Discharge from hospital Pt/family agrees with progress & goals achieved: Yes Date patient discharged from therapy: 06/18/20   Victorino Sparrow, LRT/CTRS  Ria Comment, Yeny Schmoll A 06/18/2020, 11:34 AM

## 2020-06-18 NOTE — Progress Notes (Signed)
Patient discharged to current home. Denied SI/HI and verbalized understanding of discharge instructions. No sign of distress upon discharge.

## 2020-06-18 NOTE — Progress Notes (Signed)
Recreation Therapy Notes  Date: 4.11.22 Time: 1791-5056 Location: [address] Dayroom  Group Topic: Coping Skills  Goal Area(s) Addresses:  Patient will identify positive coping skills. Patient will identify benefit of using coping skills.  Intervention: Worksheet, Pencils  Activity: Mind Map.  LRT and patients filled out the first eight boxes (communication, anxiety, mania, depression, dreams, stress, lack of sleep and hunger) of the mind map together. Patients were then given time to come up with coping skills for each circumstance.  Patients would then come back together as a group and LRT would write the coping skills on the board.  Education:Coping Skills, Discharge Planning.   Education Outcome: Acknowledges understanding/In group clarification offered/Needs additional education.   Clinical Observations/Feedback: Pt did not attend group session.   Caroll Rancher, LRT/CTRS         Caroll Rancher A 06/18/2020 11:31 AM

## 2021-08-09 ENCOUNTER — Ambulatory Visit: Payer: Self-pay | Admitting: Student

## 2021-08-09 NOTE — Progress Notes (Deleted)
   Subjective:    Patient ID: Angie Lopez, female    DOB: Jan 18, 2001, 21 y.o.   MRN: [id number]   CC: Establish care  HPI:  Angie Lopez is a very pleasant 21 y.o. female who presents today to establish care. Patient's last hospital admission 06/18/20 for mood stabilization. Was discharged on benztropine, haloperidol. No other documented admissions.   Initial concerns:***  Past medical history: Marijuana use, delusional disorder, Bipolar 1 disorder, h/o aggressive behavior, schizophrenia versus schizoaffective disorder  Past surgical history: Past Surgical History:  Procedure Laterality Date   HIP PINNING  12/2009    Current medications: ***  Family history: Family History  Problem Relation Age of Onset   Obesity Sister    Obesity Brother    Hypertension Maternal Grandmother    Diabetes Maternal Grandmother    Obesity Maternal Grandmother    Hypertension Maternal Grandfather    Obesity Mother    Diabetes Maternal Aunt    Obesity Maternal Aunt    Diabetes Cousin    Thyroid disease Neg Hx      Social history:***  ROS: pertinent noted in the HPI   Objective:  There were no vitals taken for this visit.  Vitals and nursing note reviewed  General: NAD, pleasant, able to participate in exam Cardiac: RRR, S1 S2 present. normal heart sounds, no murmurs. Respiratory: CTAB, normal effort, No wheezes, rales or rhonchi Abdomen: Bowel sounds present, non-tender, non-distended, no hepatosplenomegaly Extremities: no edema or cyanosis. Skin: warm and dry, no rashes noted Neuro: alert, no obvious focal deficits Psych: Normal affect and mood   Assessment & Plan:    No problem-specific Assessment & Plan notes found for this encounter.   In need of chlamydia screening, TDAP, pap   Dyann Goodspeed [facility] Family Medicine PGY-1

## 2021-08-09 NOTE — Patient Instructions (Incomplete)
It was great to see you today! Thank you for choosing [facility] for your primary care. Angie Lopez was seen for new patient appt.  Today we addressed: ***   No orders of the defined types were placed in this encounter.  No orders of the defined types were placed in this encounter.   If you haven't already, sign up for My Chart to have easy access to your labs results, and communication with your primary care physician.  We are checking some labs today. If they are abnormal, I will call you. If they are normal, I will send you a MyChart message (if it is active) or a letter in the mail. If you do not hear about your labs in the next 2 weeks, please call the office.   You should return to our clinic No follow-ups on file.  I recommend that you always bring your medications to each appointment as this makes it easy to ensure you are on the correct medications and helps [place] not miss refills when you need them.  Please arrive 15 minutes before your appointment to ensure smooth check in process.  We appreciate your efforts in making this happen.  Please call the clinic at [phone] if your symptoms worsen or you have any concerns.  Thank you for allowing me to participate in your care, Katniss Weedman [employer]

## 2021-08-28 ENCOUNTER — Ambulatory Visit (INDEPENDENT_AMBULATORY_CARE_PROVIDER_SITE_OTHER): Payer: Medicaid Other | Admitting: Student

## 2021-08-28 ENCOUNTER — Other Ambulatory Visit: Payer: Self-pay | Admitting: Student

## 2021-08-28 ENCOUNTER — Encounter: Payer: Self-pay | Admitting: Student

## 2021-08-28 ENCOUNTER — Other Ambulatory Visit (HOSPITAL_COMMUNITY)
Admission: RE | Admit: 2021-08-28 | Discharge: 2021-08-28 | Disposition: A | Discharge status: Home / Self Care | Payer: Medicaid Other | Source: Ambulatory Visit | Attending: Family Medicine | Admitting: Family Medicine

## 2021-08-28 VITALS — BP 110/75 | HR 119 | Temp 99.0°F | Ht 68.9 in | Wt 178.8 lb

## 2021-08-28 DIAGNOSIS — Z202 Contact with and (suspected) exposure to infections with a predominantly sexual mode of transmission: Secondary | ICD-10-CM | POA: Diagnosis not present

## 2021-08-28 DIAGNOSIS — B9689 Other specified bacterial agents as the cause of diseases classified elsewhere: Secondary | ICD-10-CM

## 2021-08-28 DIAGNOSIS — F22 Delusional disorders: Secondary | ICD-10-CM

## 2021-08-28 DIAGNOSIS — Z0000 Encounter for general adult medical examination without abnormal findings: Secondary | ICD-10-CM

## 2021-08-28 DIAGNOSIS — A5901 Trichomonal vulvovaginitis: Secondary | ICD-10-CM

## 2021-08-28 LAB — POCT WET PREP (WET MOUNT): Clue Cells Wet Prep Whiff POC: POSITIVE

## 2021-08-28 MED ORDER — METRONIDAZOLE 500 MG PO TABS: 500.0000 mg | ORAL_TABLET | Freq: Two times a day (BID) | ORAL | 0 refills | Status: AC

## 2021-08-28 NOTE — Progress Notes (Signed)
Metronidazole ordered to treat trich and BV, will call patient with results.

## 2021-08-28 NOTE — Assessment & Plan Note (Signed)
History of schizophrenia versus schizoaffective disorder per her last psychiatric note when inpatient last year 06/2020.  Patient was discharged on Haldol and benztropine but has not currently taking medication.  Patient does appear that she can care for self and has close relationship with her mother.  No aggressive behavior in room.  She is amenable to seeing psychiatry, referral placed for assistance.  Asked her mother to be present at next visit in 1 week.
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
## 2021-08-28 NOTE — Patient Instructions (Addendum)
It was great to see you today! Thank you for choosing [facility] for your primary care. Angie Lopez was seen for new patient appt.  Today we addressed: Checking for STIs through blood work and would like to refer you to psychiatry to see if you need any medications   I will follow up with you in 1 week or so for a pap smear   Orders Placed This Encounter  Procedures   Hepatitis C Antibody   HIV antibody (with reflex)   RPR   hCG, quantitative, pregnancy   Ambulatory referral to Psychiatry    Referral Priority:   Routine    Referral Type:   Psychiatric    Referral Reason:   Specialty Services Required    Requested Specialty:   Psychiatry    Number of Visits Requested:   1   No orders of the defined types were placed in this encounter.   If you haven't already, sign up for My Chart to have easy access to your labs results, and communication with your primary care physician.  We are checking some labs today. If they are abnormal, I will call you. If they are normal, I will send you a MyChart message (if it is active) or a letter in the mail. If you do not hear about your labs in the next 2 weeks, please call the office.   You should return to our clinic Return in about 1 week (around 09/04/2021) for pap smear.  I recommend that you always bring your medications to each appointment as this makes it easy to ensure you are on the correct medications and helps [place] not miss refills when you need them.  Please arrive 15 minutes before your appointment to ensure smooth check in process.  We appreciate your efforts in making this happen.  Please call the clinic at [phone] if your symptoms worsen or you have any concerns.  Thank you for allowing me to participate in your care, @[email or web address] @

## 2021-08-28 NOTE — Assessment & Plan Note (Signed)
Unreliable historian, although high risk of STIs.  We will check for HIV, RPR, GC/chlamydia, hepatitis C for healthcare maintenance.  Will perform Pap smear at next visit in 1 week.

## 2021-08-29 ENCOUNTER — Telehealth: Payer: Self-pay | Admitting: Student

## 2021-08-29 ENCOUNTER — Encounter: Payer: Self-pay | Admitting: Student

## 2021-08-29 LAB — RPR: RPR Ser Ql: NONREACTIVE

## 2021-08-29 LAB — HEPATITIS C ANTIBODY: Hep C Virus Ab: NONREACTIVE

## 2021-08-29 LAB — CERVICOVAGINAL ANCILLARY ONLY
Chlamydia: POSITIVE — AB
Comment: NEGATIVE
Comment: NORMAL
Neisseria Gonorrhea: NEGATIVE

## 2021-08-29 LAB — BETA HCG QUANT (REF LAB): hCG Quant: <1 m[IU]/mL

## 2021-08-29 LAB — HIV ANTIBODY (ROUTINE TESTING W REFLEX): HIV Screen 4th Generation wRfx: NONREACTIVE

## 2021-08-29 NOTE — Progress Notes (Signed)
Letter sent for labs.

## 2021-08-29 NOTE — Telephone Encounter (Signed)
Patient updated about results, metronidazole at pharmacy. All questions answered.

## 2021-08-30 ENCOUNTER — Other Ambulatory Visit: Payer: Self-pay | Admitting: Student

## 2021-08-30 DIAGNOSIS — A749 Chlamydial infection, unspecified: Secondary | ICD-10-CM

## 2021-08-30 MED ORDER — DOXYCYCLINE HYCLATE 100 MG PO TABS: 100.0000 mg | ORAL_TABLET | Freq: Two times a day (BID) | ORAL | 0 refills | Status: AC

## 2021-09-03 ENCOUNTER — Ambulatory Visit: Payer: Medicaid Other | Admitting: Student

## 2021-09-03 NOTE — Progress Notes (Deleted)
    SUBJECTIVE:   CHIEF COMPLAINT / HPI:   Pap smear: Patient presenting for a pap smear today. Currently being treated for BV, trich, and chlamydia.  See sexual history in note from 08/28/21.  Contraception   PERTINENT  PMH / PSH: ***  OBJECTIVE:   There were no vitals taken for this visit.  General: Alert and oriented in no apparent distress Heart: Regular rate and rhythm with no murmurs appreciated Lungs: CTA bilaterally, no wheezing Abdomen: Bowel sounds present, no abdominal pain Skin: Warm and dry Extremities: No lower extremity edema Female genitalia: {genitourinary exam:311642::"not done"} Chaperoned by CMA ***   ASSESSMENT/PLAN:   No problem-specific Assessment & Plan notes found for this encounter.     Alfredo Martinez, MD [facility]

## 2021-09-05 ENCOUNTER — Telehealth: Payer: Self-pay

## 2021-09-05 NOTE — Telephone Encounter (Signed)
STI sheet was sent to [facility] department.  Aquilla Solian, CMA

## 2021-09-05 NOTE — Telephone Encounter (Signed)
-----   Message from Jennette Bill, CMA sent at 09/02/2021  9:12 AM EDT ----- Regarding: STI reporting Positive chlamydia

## 2021-10-03 ENCOUNTER — Ambulatory Visit: Payer: Medicaid Other | Admitting: Student

## 2021-10-03 NOTE — Progress Notes (Deleted)
  SUBJECTIVE:   CHIEF COMPLAINT / HPI:   Pap smear  - Coming in for pap smear. On last visit treated for chlamydia, Trich, BV Symptoms - Abnormal vaginal discharge: *** - Missed period: *** - Fever: *** - Abdominal/Pelvic pain: *** - Vaginal bleeding: *** - Pain during sex: *** - Rash: ***  PERTINENT  PMH / PSH:   Past Medical History:  Diagnosis Date   Acne 08/2011   epiduo 2012   ADHD (attention deficit hyperactivity disorder) 2009   HTN on stimulants, off meds 03/2012   Allergic conjunctivitis 2012   Allergic rhinitis 08/2010   Learning disorder 08/2010   borderline IQ, had IEP in 2012   Obesity 08/13/2010   Prediabetes    HBA1C 6.0 03/2010, never took metromin in 2012-2014   SCFE (slipped capital femoral epiphysis) 2011    OBJECTIVE:  There were no vitals taken for this visit.  General: NAD, pleasant, able to participate in exam Cardiac: RRR, no murmurs auscultated Respiratory: CTAB, normal WOB Abdomen: soft, non-tender, non-distended, normoactive bowel sounds Extremities: warm and well perfused, no edema or cyanosis Skin: warm and dry, no rashes noted Neuro: alert, no obvious focal deficits, speech normal Psych: Normal affect and mood  ASSESSMENT/PLAN:  No problem-specific Assessment & Plan notes found for this encounter.   No orders of the defined types were placed in this encounter.  No orders of the defined types were placed in this encounter.  No follow-ups on file. Alfredo Martinez, MD 10/03/2021, 12:09 PM PGY-2, [facility] {    This will disappear when note is signed, click to select method of visit    :1}

## 2021-10-17 ENCOUNTER — Ambulatory Visit: Payer: Medicaid Other | Admitting: Student

## 2021-10-17 NOTE — Progress Notes (Deleted)
  SUBJECTIVE:   CHIEF COMPLAINT / HPI:   Pap smear - recently became sexually active with *** - preferred gender of partner: *** - Sexually active with *** *** partner(s) in past year  - Last sexual encounter: *** - Contraception: *** Symptoms - Abnormal vaginal discharge: *** - Missed period: *** - Fever: *** - Abdominal/Pelvic pain: *** - Vaginal bleeding: *** - Pain during sex: *** - Rash: ***  Recently with Trich, BV, and chlamydia, treated with doxy and metro.    No previously documented pap smear   PERTINENT  PMH / PSH:   Past Medical History:  Diagnosis Date   Acne 08/2011   epiduo 2012   ADHD (attention deficit hyperactivity disorder) 2009   HTN on stimulants, off meds 03/2012   Allergic conjunctivitis 2012   Allergic rhinitis 08/2010   Learning disorder 08/2010   borderline IQ, had IEP in 2012   Obesity 08/13/2010   Prediabetes    HBA1C 6.0 03/2010, never took metromin in 2012-2014   SCFE (slipped capital femoral epiphysis) 2011    OBJECTIVE:  There were no vitals taken for this visit.  General: NAD, pleasant, able to participate in exam Cardiac: RRR, no murmurs auscultated Respiratory: CTAB, normal WOB Abdomen: soft, non-tender, non-distended, normoactive bowel sounds Extremities: warm and well perfused, no edema or cyanosis Skin: warm and dry, no rashes noted Neuro: alert, no obvious focal deficits, speech normal Psych: Normal affect and mood  ASSESSMENT/PLAN:  No problem-specific Assessment & Plan notes found for this encounter.   No orders of the defined types were placed in this encounter.  No orders of the defined types were placed in this encounter.  No follow-ups on file. Alfredo Martinez, MD 10/17/2021, 7:18 AM PGY-2,  Family Medicine {    This will disappear when note is signed, click to select method of visit    :1}

## 2021-10-24 ENCOUNTER — Encounter: Payer: Self-pay | Admitting: Student

## 2021-10-24 ENCOUNTER — Other Ambulatory Visit (HOSPITAL_COMMUNITY)
Admission: RE | Admit: 2021-10-24 | Discharge: 2021-10-24 | Disposition: A | Discharge status: Home / Self Care | Payer: Medicaid Other | Source: Ambulatory Visit | Attending: Family Medicine | Admitting: Family Medicine

## 2021-10-24 ENCOUNTER — Ambulatory Visit (INDEPENDENT_AMBULATORY_CARE_PROVIDER_SITE_OTHER): Payer: Medicaid Other | Admitting: Student

## 2021-10-24 VITALS — BP 118/69 | HR 90 | Ht 68.0 in | Wt 195.0 lb

## 2021-10-24 DIAGNOSIS — Z202 Contact with and (suspected) exposure to infections with a predominantly sexual mode of transmission: Secondary | ICD-10-CM | POA: Diagnosis not present

## 2021-10-24 DIAGNOSIS — A749 Chlamydial infection, unspecified: Secondary | ICD-10-CM | POA: Diagnosis not present

## 2021-10-24 DIAGNOSIS — F310 Bipolar disorder, current episode hypomanic: Secondary | ICD-10-CM

## 2021-10-24 DIAGNOSIS — Z309 Encounter for contraceptive management, unspecified: Secondary | ICD-10-CM | POA: Diagnosis not present

## 2021-10-24 LAB — POCT URINE PREGNANCY: Preg Test, Ur: NEGATIVE

## 2021-10-24 LAB — POCT WET PREP (WET MOUNT)
Clue Cells Wet Prep Whiff POC: NEGATIVE
Trichomonas Wet Prep HPF POC: ABSENT
WBC, Wet Prep HPF POC: NONE SEEN

## 2021-10-24 MED ORDER — MEDROXYPROGESTERONE ACETATE 150 MG/ML IM SUSP
150.0000 mg | Freq: Once | INTRAMUSCULAR | Status: AC
  Administered 2021-10-24: 150 mg via INTRAMUSCULAR

## 2021-10-24 MED ORDER — AZITHROMYCIN 500 MG PO TABS
1000.0000 mg | ORAL_TABLET | Freq: Once | ORAL | Status: AC
  Administered 2021-10-24: 1000 mg via ORAL

## 2021-10-24 NOTE — Patient Instructions (Addendum)
It was great to see you today! Thank you for choosing [facility] for your primary care. Shagun Blethen was seen for pap smear.  Today we addressed: I will message or call with any results  Please use protection with each sexual encounter Return in the next 3 months for Nexplanon   If you haven't already, sign up for My Chart to have easy access to your labs results, and communication with your primary care physician.  We are checking some labs today. If they are abnormal, I will call you. If they are normal, I will send you a MyChart message (if it is active) or a letter in the mail. If you do not hear about your labs in the next 2 weeks, please call the office. I recommend that you always bring your medications to each appointment as this makes it easy to ensure you are on the correct medications and helps [place] not miss refills when you need them. Call the clinic at [phone] if your symptoms worsen or you have any concerns.  You should return to our clinic Return in about 3 months (around 01/24/2022) for Nexplanon placement . Please arrive 15 minutes before your appointment to ensure smooth check in process.  We appreciate your efforts in making this happen.  Thank you for allowing me to participate in your care, Alfredo Martinez, MD 10/24/2021, 3:15 PM PGY-2, [facility]

## 2021-10-24 NOTE — Assessment & Plan Note (Signed)
Did not complete course of doxycycline, continue with azithromycin 1 g, monitored for 15 minutes Patient did not have any adverse reaction to the medication.

## 2021-10-24 NOTE — Assessment & Plan Note (Signed)
Discussed the patient's options of birth control, decided to use the Depo for 3 months and then continue with Nexplanon.  Patient was instructed to make an appointment for Nexplanon. Reviewed labs and allergies, offered condoms, will check GC/Chlamydia and Trichomonas and will call patient with results. Discussed safe sex practices. Will continue with pap today as well. Patient's questions answered to their satisfaction.

## 2021-10-24 NOTE — Progress Notes (Signed)
  SUBJECTIVE:   CHIEF COMPLAINT / HPI:   Pap smear:  + for BV, Trich, Chlamydia on last check. Treated with doxy and metronidazole, but patient was unable to complete metronidazole course as she notes that the pills were too big. Patient denies vaginal discharge or urinary symptoms Patient has had sexual intercourse since last appointment but did use a condom at that time.   History of concern for schizoaffective versus schizophrenia disorder not on medications or therapy. Sent psychiatry referral on last visit.   Pt has a stable job at [employer] and lives with her mom   PERTINENT  PMH / PSH:   Past Medical History:  Diagnosis Date   Acne 08/2011   epiduo 2012   ADHD (attention deficit hyperactivity disorder) 2009   HTN on stimulants, off meds 03/2012   Allergic conjunctivitis 2012   Allergic rhinitis 08/2010   Learning disorder 08/2010   borderline IQ, had IEP in 2012   Obesity 08/13/2010   Prediabetes    HBA1C 6.0 03/2010, never took metromin in 2012-2014   SCFE (slipped capital femoral epiphysis) 2011    OBJECTIVE:  BP 118/69   Pulse 90   Ht 5\' 8"  (1.727 m)   Wt 195 lb (88.5 kg)   LMP 10/10/2021   SpO2 99%   BMI 29.65 kg/m   General: NAD, pleasant, able to participate in exam Cardiac: RRR, no murmurs auscultated Respiratory: CTAB, normal WOB Abdomen: soft, non-tender, non-distended, normoactive bowel sounds Extremities: warm and well perfused, no edema or cyanosis Skin: warm and dry, no rashes noted GU: Chaperoned by 12/10/2021 [place] normal vaginal vault and cervical, scant thin, white mucous  Psych: Slightly tangential but answers questions appropriately with normal affect   ASSESSMENT/PLAN:  Possible exposure to STD Discussed the patient's options of birth control, decided to use the Depo for 3 months and then continue with Nexplanon.  Patient was instructed to make an appointment for Nexplanon. Reviewed labs and allergies, offered condoms, will check GC/Chlamydia  and Trichomonas and will call patient with results. Discussed safe sex practices. Will continue with pap today as well. Patient's questions answered to their satisfaction.   Chlamydia Did not complete course of doxycycline, continue with azithromycin 1 g, monitored for 15 minutes Patient did not have any adverse reaction to the medication.   Bipolar affective disorder, current episode hypomanic (HCC) Many documented mental health disorders and patient's problem list.  Patient is appropriately responsive today, offered to call mom but patient declines.  Patient has stable living situation and work environment.  She has no problems with transportation.  Already placed psychiatry referral for the patient.   Orders Placed This Encounter  Procedures   POCT Wet Prep [facility])   POCT urine pregnancy   Meds ordered this encounter  Medications   azithromycin (ZITHROMAX) tablet 1,000 mg   medroxyPROGESTERone (DEPO-PROVERA) injection 150 mg   Return in about 3 months (around 01/24/2022) for Nexplanon placement . 01/26/2022, MD 10/24/2021, 4:45 PM PGY-2, [facility]

## 2021-10-24 NOTE — Assessment & Plan Note (Signed)
Many documented mental health disorders and patient's problem list.  Patient is appropriately responsive today, offered to call mom but patient declines.  Patient has stable living situation and work environment.  She has no problems with transportation.  Already placed psychiatry referral for the patient.

## 2021-10-28 LAB — CYTOLOGY - PAP
Adequacy: ABSENT
Chlamydia: NEGATIVE
Comment: NEGATIVE
Comment: NORMAL
Diagnosis: NEGATIVE
Neisseria Gonorrhea: NEGATIVE

## 2022-03-21 ENCOUNTER — Ambulatory Visit: Payer: Medicaid Other | Admitting: Student

## 2022-04-14 ENCOUNTER — Ambulatory Visit: Payer: Medicaid Other | Admitting: Student

## 2022-04-14 NOTE — Progress Notes (Deleted)
  SUBJECTIVE:   CHIEF COMPLAINT / HPI:   Presenting for STI follow up:  + BV, trich, chlamydia previosuly and treated with Azithro and doxycycline  PERTINENT  PMH / PSH: ***  Past Medical History:  Diagnosis Date   Acne 08/2011   epiduo 2012   ADHD (attention deficit hyperactivity disorder) 2009   HTN on stimulants, off meds 03/2012   Allergic conjunctivitis 2012   Allergic rhinitis 08/2010   Learning disorder 08/2010   borderline IQ, had IEP in 2012   Obesity 08/13/2010   Prediabetes    HBA1C 6.0 03/2010, never took metromin in 2012-2014   SCFE (slipped capital femoral epiphysis) 2011    OBJECTIVE:  There were no vitals taken for this visit. Physical Exam   ASSESSMENT/PLAN:  There are no diagnoses linked to this encounter. No follow-ups on file. Erskine Emery, MD 04/14/2022, 12:36 PM PGY-2, [place] {    This will disappear when note is signed, click to select method of visit    :1}

## 2023-01-26 ENCOUNTER — Encounter: Payer: Self-pay | Admitting: Student

## 2023-01-26 ENCOUNTER — Ambulatory Visit: Payer: MEDICAID | Admitting: Student

## 2023-01-26 VITALS — BP 119/81 | HR 102 | Ht 68.0 in | Wt 198.8 lb

## 2023-01-26 DIAGNOSIS — Z3009 Encounter for other general counseling and advice on contraception: Secondary | ICD-10-CM | POA: Diagnosis not present

## 2023-01-26 DIAGNOSIS — F319 Bipolar disorder, unspecified: Secondary | ICD-10-CM | POA: Diagnosis not present

## 2023-01-26 LAB — POCT URINE PREGNANCY: Preg Test, Ur: NEGATIVE

## 2023-01-26 MED ORDER — MEDROXYPROGESTERONE ACETATE 150 MG/ML IM SUSP
150.0000 mg | Freq: Once | INTRAMUSCULAR | Status: AC
  Administered 2023-01-26: 150 mg via INTRAMUSCULAR
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
## 2023-01-26 NOTE — Patient Instructions (Addendum)
It was great to see you today! Thank you for choosing [facility] for your primary care.  Today we addressed:  Please contact one of the locations below for therapy resources  I will send a referral to psychiatry to see them  Please return in 1 month Need a depo 3 months from now   Therapy and Counseling Resources Most providers on this list will take Medicaid. Patients with commercial insurance or Medicare should contact their insurance company to get a list of in network providers.  [facility] (spanish speaking therapist available)(habla espanol)(take medicare and medicaid)  [address], [place], [place] [address], [place] [email or web address] [phone]  BestDay:Psychiatry and Counseling [address] [facility] [address] [place], [place] [address] [phone]  [facility]   [address] Suite [place], [place] [address]      [phone]  [facility] (spanish available) [address]  [place], [place] [address] [phone]  [facility] (take [facility] and medicare) [address]  [place], [place] [address]       [phone]     [facility] (virtual only) [phone]  Jovita Kussmaul [facility] Care [address] [place], [place] [phone]  [facility]:  [address] [place] [phone]  [facility]:  [address] [place] [phone]  [facility] (under & uninsured) [address]   [place] [phone]    [email or web address]    [facility] [address]  [place]    [phone]  [facility] -[address]     Phone:  [phone]     [facility]-  [address]  [phone]   [facility] [address]      [place], [place] [phone]  [facility]: [address] [place],  [place], [place]  [phone]   [facility] (Spanish therapist) [email or web address]  [address] [place]  [address]   [place] [address]    [phone]    [facility]   [address],  [place], [place]  [phone]   [facility]  [address] [place]  [phone]  [facility]  [address] [place], [place]        [phone]  Open Access/Walk In Clinic under & uninsured  [facility]  [address] [place], [place] [phone] Crisis [phone]  [facility] the [place],  (Spanish)   [address], [place]: [phone]) 8:30 - 12; 1 - 2:30  [facility] of the [employer],  [address] [place], [place]    [phone]):8:30 - 12; 2 - 3PM  [facility] [employer],  [address],  [place]; [phone]):   Mon - Fri 8 AM - 5 PM  [facility] [address] [place]  MWF 12:30 to 3:00 or call to schedule an appointment  [phone]  Specific Provider options Psychology Today  [email or web address]/us click on find a therapist  enter your zip code left side and select or tailor a therapist for your specific need.   [facility] Provider Directory [email or web address]  (Medicaid)   Follow all drop down to find a provider  Social Support program Mental Health [place] [phone] or PhotoSolver.pl [address], [place], [place] Recovery support and educational  24- Hour Availability:   [facility]  [address] [place], [place] [phone] Crisis [phone]  [facility] the [facility] [phone]  [place] [facility]  [phone]   [facility]  [phone] (after hours)  Therapeutic Alternative/Mobile Crisis   [phone]  [place] National Suicide Hotline   (647) 605-5024 Len Childs)  Call 911 or go to emergency room  [facility]  [phone]);  [place] and [employer]  [phone]); [place], [place], [place], [place], [place], [place], [place]   If you haven't already, sign up for My Chart to have easy access to your labs results, and communication with your primary care physician.   Please arrive 15 minutes before your appointment to ensure smooth check in process.  We appreciate your efforts in making this happen.  Thank you for allowing me to participate in your care, Alfredo Martinez, MD 01/26/2023, 3:55 PM PGY-3, [facility]

## 2023-01-26 NOTE — Assessment & Plan Note (Signed)
Patient with multiple previous psychiatric diagnoses.  Is stable today and living with her mother.  Will continue with referral to psychiatry, patient is agreeable to this as well as providing the patient with therapy resources for her to start therapy.  Patient verbalizes understanding that seeing psychiatry is imperative.  Follow-up in 1 month.

## 2023-02-18 ENCOUNTER — Ambulatory Visit: Payer: Self-pay | Admitting: Student

## 2023-02-18 NOTE — Progress Notes (Unsigned)
    SUBJECTIVE:   CHIEF COMPLAINT / HPI:   Bipolar 1 Disorder:  -Last instructed to see psychiatry -  Per Psych History: Patient was admitted to the hospital with a past medical history of schizophrenia versus schizoaffective disorder for involuntary commitment most recently on 06/2020 and was discharged on haloperidol and benztropine but not taking any medications today.   PERTINENT  PMH / PSH:  ADHD  Allergies  Obesity  Prediabetes  SCFE  OBJECTIVE:   LMP 01/22/2023   General: Alert and oriented in no apparent distress Heart: Regular rate and rhythm with no murmurs appreciated Lungs: CTA bilaterally, no wheezing Abdomen: Bowel sounds present, no abdominal pain Skin: Warm and dry Extremities: No lower extremity edema   ASSESSMENT/PLAN:   No problem-specific Assessment & Plan notes found for this encounter.     Alfredo Martinez, MD [facility]

## 2023-03-06 ENCOUNTER — Telehealth: Payer: Self-pay | Admitting: *Deleted

## 2023-03-06 ENCOUNTER — Encounter: Payer: Self-pay | Admitting: Student

## 2023-03-06 ENCOUNTER — Ambulatory Visit (INDEPENDENT_AMBULATORY_CARE_PROVIDER_SITE_OTHER): Payer: MEDICAID | Admitting: Student

## 2023-03-06 VITALS — BP 128/80 | HR 95 | Ht 68.0 in | Wt 191.8 lb

## 2023-03-06 DIAGNOSIS — F319 Bipolar disorder, unspecified: Secondary | ICD-10-CM

## 2023-03-06 DIAGNOSIS — Z599 Problem related to housing and economic circumstances, unspecified: Secondary | ICD-10-CM

## 2023-03-06 NOTE — Progress Notes (Signed)
    SUBJECTIVE:   CHIEF COMPLAINT / HPI:   Mood Follow Up: Last recommended referral to psychiatry but has yet to call them  Not having any problems at home  Has an interview with [employer]  Lives with mom but reports that mom has her own "problems" Patient reports that she can sometimes feel down but has no SI or HI She attributes feeling down to lack of employment No fever or chills Patient has not been on medications, in the past was on Cogentin and haloperidol but no longer takes these medications. Patient reports that she would like some help with housing and resources in the community.   PERTINENT  PMH / PSH:  ADHD  Allergies  Obesity  Prediabetes  SCFE  OBJECTIVE:   BP 128/80   Pulse 95   Ht 5\' 8"  (1.727 m)   Wt 191 lb 12.8 oz (87 kg)   SpO2 99%   BMI 29.16 kg/m   General: Alert and oriented in no apparent distress Heart: Regular rate and rhythm with no murmurs appreciated Lungs: Normal work of breathing Abdomen: no abdominal pain Skin: Warm and dry Psych: Normal mood and affect   ASSESSMENT/PLAN:   Assessment & Plan Bipolar 1 disorder (HCC) Discussed that it is imperative for her to call psychiatry and provided the phone number to call. Patient has a good relationship with her mother but notes that her mother has issues as well, does not go into further detail than that.  Patient is stable for outpatient follow-up as she has no SI or HI, no hallucinations and is doing well.  Congratulated her on her interview.  Scheduled a 40-minute follow-up appointment in 1 month.  If she has yet to call psychiatry at that time, would be reasonable to either call mother on the phone or schedule appointment with psychiatry while she is here. Housing problems Feel it is appropriate to continue with social work referral as the patient is requesting assistance with housing and needs assistance with psychiatry follow-up as well as therapy appointments.  Placed referral,  instructed the patient to wait for the phone call.  Scheduled the patient for 40-minute follow-up in 1 month prior to leaving today.     Alfredo Martinez, MD [facility]

## 2023-03-06 NOTE — Assessment & Plan Note (Signed)
Discussed that it is imperative for her to call psychiatry and provided the phone number to call. Patient has a good relationship with her mother but notes that her mother has issues as well, does not go into further detail than that.  Patient is stable for outpatient follow-up as she has no SI or HI, no hallucinations and is doing well.  Congratulated her on her interview.  Scheduled a 40-minute follow-up appointment in 1 month.  If she has yet to call psychiatry at that time, would be reasonable to either call mother on the phone or schedule appointment with psychiatry while she is here.

## 2023-03-06 NOTE — Progress Notes (Unsigned)
Complex Care Management Note Care Guide Note  03/06/2023 Name: Angie Lopez MRN: [id number] DOB: July 01, 2000   Complex Care Management Outreach Attempts: An unsuccessful telephone outreach was attempted today to offer the patient information about available complex care management services.  Follow Up Plan:  Additional outreach attempts will be made to offer the patient complex care management information and services.   Encounter Outcome:  No Answer  Gwenevere Ghazi  Care Coordination Care Guide  Direct Dial: [phone]

## 2023-03-06 NOTE — Patient Instructions (Addendum)
It was great to see you today! Thank you for choosing [facility] for your primary care.  Today we addressed: [phone] - please call to schedule appt for psychiatry Social work will call you to discuss housing concerns   If you haven't already, sign up for My Chart to have easy access to your labs results, and communication with your primary care physician. I recommend that you always bring your medications to each appointment as this makes it easy to ensure you are on the correct medications and helps [place] not miss refills when you need them. Call the clinic at [phone] if your symptoms worsen or you have any concerns.  Please arrive 15 minutes before your appointment to ensure smooth check in process.  We appreciate your efforts in making this happen.  Thank you for allowing me to participate in your care, Alfredo Martinez, MD 03/06/2023, 9:55 AM PGY-3, [facility]

## 2023-03-19 NOTE — Progress Notes (Signed)
 Complex Care Management Note Care Guide Note  03/19/2023 Name: Angie Lopez MRN: [id number] DOB: [birth year]   Complex Care Management Outreach Attempts: A second unsuccessful outreach was attempted today to offer the patient with information about available complex care management services.  Follow Up Plan:  Additional outreach attempts will be made to offer the patient complex care management information and services.   Encounter Outcome:  No Answer  Harlene Satterfield  Care Coordination Care Guide  Direct Dial: [phone]

## 2023-03-20 ENCOUNTER — Other Ambulatory Visit: Payer: Self-pay | Admitting: Licensed Clinical Social Worker

## 2023-03-20 DIAGNOSIS — F22 Delusional disorders: Secondary | ICD-10-CM

## 2023-03-20 DIAGNOSIS — F4321 Adjustment disorder with depressed mood: Secondary | ICD-10-CM

## 2023-03-20 DIAGNOSIS — F319 Bipolar disorder, unspecified: Secondary | ICD-10-CM

## 2023-03-20 DIAGNOSIS — F3110 Bipolar disorder, current episode manic without psychotic features, unspecified: Secondary | ICD-10-CM

## 2023-03-20 NOTE — Patient Instructions (Signed)
 Tailored Plan Medicaid On July 1, some people on [place] Medicaid will move to a new kind of Medicaid health plan called a Tailored Plan. Tailored Plans cover your doctor visits, prescription drugs, and health care services.    If your [place] Medicaid will move to a Tailored Plan, you should have gotten a letter and welcome packet. If you're not sure, call your [place] Medicaid Enrollment Broker at [phone] and ask.  Check out these free materials, in [place] and [place], to learn more about your Tailored Plan: Medicaid.NCDHHS.Gov/Tailored-Plans/Toolkit  Tailored Care Management Services  TCM services are available to you now. If you are a Tailored Plan member or will be and want information about Tailored Care Management Services including rides to appointments and community and home services, call the Care Management provider for your county of residence:    [facility] ([place], [place])  Member Services: [phone] Behavioral Health Crisis Line: [phone], [place], [place], [place], [place])  Member Services: [phone] Behavioral Health Crisis Line: [phone]  [facility] [place]) Member Services: [phone] Behavioral Health Crisis Line: [phone]

## 2023-03-20 NOTE — Patient Outreach (Signed)
  Medicaid Managed Care Social Work Note  03/20/2023 Name:  Angie Lopez MRN:  [id number] DOB:  04-07-2000  Angie Lopez is an 23 y.o. year old female who is a primary patient of Bryan Bianchi, MD.  The Medicaid Managed Care Coordination team was consulted for assistance with:  Community Resources   Angie Lopez was given information about Medicaid Managed Care Coordination team services today. Angie Lopez  is aware to contact   [facility] to get connected to their case management program. Patient wrote this number physically down and agreed to contact them as soon as possible to gain these needed services and benefits.   Engaged with patient  for by telephone forinitial visit in response to referral for case management and/or care coordination services.   Patient is participating in a Managed Medicaid Plan:  Yes- [employer]   Assessments/Interventions:  Review of past medical history, allergies, medications, health status, including review of consultants reports, laboratory and other test data, was performed as part of comprehensive evaluation and provision of chronic care management services.  SDOH: (Social Drivers of Health) assessments and interventions performed: SDOH Interventions    Flowsheet Row Patient Outreach Telephone from 03/20/2023 in [place] POPULATION HEALTH DEPARTMENT  SDOH Interventions   Food Insecurity Interventions Community Resources Provided  Angie Lopez food stamps lasped and pt needs to reapply for services- resources given on how to do this online]  Housing Interventions Community Resources Provided  Angie Lopez resources sent]  Transportation Interventions Community Resources Provided, Payor Benefit  Utilities Interventions Community Resources Provided  Stress Interventions Community Resources Provided, Provide Counseling       Advanced Directives Status:  Not addressed in this encounter.  Care Plan                 No Known Allergies  Medications  Reviewed Today   Medications were not reviewed in this encounter     Patient Active Problem List   Diagnosis Date Noted   Healthcare maintenance 08/28/2021   Bipolar 1 disorder (HCC) 06/13/2020   Bipolar affective disorder, current episode hypomanic (HCC)    Marijuana abuse, continuous 11/19/2019   Bipolar I disorder with mania (HCC) 05/29/2019   Delusional disorder (HCC) 11/19/2018   Adjustment disorder with depressed mood 09/01/2014   Patient has [employer] and was advised to contact program to get connected with their case management program. Patient is agreeable. Patient reports being interested in gaining community resources. [facility] LCSW provided patient with extensive education on behavioral health resources within her area. Patient wishes to gain behavioral health treatment through [facility]. Referral placed to [facility] for both psychiatry and counseling with patient's permission. Patient was also provided emotional support over the phone. She denies any SI/HI but admits she is in a stressful situation regarding her housing. [facility] LCSW emailed patient a list of housing, financial, food and mental health resources. [facility] LCSW included [employer] benefit information in this email as well. [facility] LCSW will sign off at this time as resources and education successfully provided.   Angie Lopez, BSW, MSW, LCSW Licensed Clinical Social Worker [employer]   [email or web address] Direct Dial: [phone]

## 2023-03-20 NOTE — Progress Notes (Signed)
 Patient scheduled by Weston Settle fro 03/20/23

## 2023-04-14 ENCOUNTER — Ambulatory Visit (INDEPENDENT_AMBULATORY_CARE_PROVIDER_SITE_OTHER): Payer: MEDICAID | Admitting: Student

## 2023-04-14 VITALS — BP 125/80 | HR 80 | Ht 68.0 in | Wt 200.0 lb

## 2023-04-14 DIAGNOSIS — K5900 Constipation, unspecified: Secondary | ICD-10-CM | POA: Diagnosis not present

## 2023-04-14 DIAGNOSIS — F319 Bipolar disorder, unspecified: Secondary | ICD-10-CM | POA: Diagnosis not present

## 2023-04-14 DIAGNOSIS — F4321 Adjustment disorder with depressed mood: Secondary | ICD-10-CM | POA: Diagnosis not present

## 2023-04-14 MED ORDER — POLYETHYLENE GLYCOL 3350 17 GM/SCOOP PO POWD: 17.0000 g | Freq: Every day | ORAL | 0 refills | Status: DC

## 2023-04-14 MED ORDER — SENNA 8.6 MG PO TABS: 1.0000 | ORAL_TABLET | Freq: Every day | ORAL | 0 refills | Status: AC

## 2023-04-14 NOTE — Progress Notes (Signed)
    SUBJECTIVE:   CHIEF COMPLAINT / HPI:   Mood Follow Up:  -needs psychiatry follow up  -not on medications  -admitted to the hospital with a past medical history of schizophrenia versus schizoaffective disorder for involuntary commitment most recently on 06/2020 and was discharged on haloperidol  and benztropine //no longer on these medications  -Patient reports that she has a job at [employer] and denies SI or HI today -No hallucinations presently -She reports that she is doing well but does need to support system and feels that people are using her -She does feel like her mom is the best support system because she also has things to worry about -Does well living with her mom, they do not fight -Working [employer] has been good, started last week and is doing well without any concerns  Spoke to social work and received resources for social concerns via telephone since last visit.    Constipation  -ongoing x2 years  -no hematochezia  -no hematemesis  -unsatisfactory BM reported but does have a couple a day -History of blood in stool but last occurred 2 years ago -Not trying anything for constipation  -No abdominal pain, nausea, vomiting, abnormal weight loss -No fevers or chills -No abdominal surgical history   PERTINENT  PMH / PSH:  ADHD  Allergies  Obesity  Prediabetes  SCFE  OBJECTIVE:   BP 125/80   Pulse 80   Ht 5' 8 (1.727 m)   Wt 200 lb (90.7 kg)   SpO2 97%   BMI 30.41 kg/m   General: Alert and oriented in no apparent distress Heart: Regular rate and rhythm with no murmurs appreciated Lungs: CTA bilaterally, normal wob Abdomen: Bowel sounds present, no abdominal pain Skin: Warm and dry Psych: broad/somewhat labile affect, mood appropriate   ASSESSMENT/PLAN:   Assessment & Plan Bipolar 1 disorder (HCC) Provided with the number for [facility] at [facility]  Therapy numbers provided  Suicide Hotline provided  Patient without SI/HI; denies active plan  and reports that they are safe to continue with outpatient treatment.  Close follow up 1 month  RN VBCI order placed to assist with appts and scheduling at home   Constipation, unspecified constipation type Ddx functional constipation, IBS, dietary causes, medication-unlikely as not on medications, hypothyroidism check at next visit with BMP to check for lyte abnormalities.  Senna and Miralax , non-acute abdomen with no red flags. No abdominal pain. Bowel Movement Diary encouraged, follow up in 1 month.      Laurier Hugger, MD [facility]

## 2023-04-14 NOTE — Patient Instructions (Addendum)
 It was great to see you today! Thank you for choosing [facility] for your primary care.  Today we addressed: Please call to schedule psychiatry appt -- [phone]. This is the [facility] in [place]  I have a nurse who will call to try to make sure you get set up with the therapy and psychiatry    Therapy and Counseling Resources  [facility] (takes children) Location 1: [address] [place], [place] [address] Location 2: [address] [place], [place] [address] [phone]   [facility] (spanish speaking therapist available)(habla espanol)(take medicare and medicaid)  [address], [place], [place] [address], [place]  [email or web address] [phone]  Best[facility]:Psychiatry and Counseling [address] [facility] [address] [address] [place], [place] [address] [phone]  [facility]   [address] Suite [place], [place] [address]      [phone]  [facility] (spanish available) [address]  [place], [place] [address] [phone]  [facility] (take [facility] and medicare) [address]  [place], [place] [address]       [phone]     [facility] (virtual only) [phone]  Janit Griffins Total Access Care [address], [place], [place] [phone]  [facility]:  [address] [place] [phone]  [facility]:  [address] [place] [phone]  [facility] (under & uninsured) [address]   [place] [phone]    [email or web address]    [facility] [address]  [place]    [phone]  [facility] -[address]     Phone:  [phone]     [facility]-  [address]  [phone]   [facility] [address]      [place], [place] [phone]  [facility]: [address] [place], [place], [place]  [phone]   [facility] (Spanish therapist) [email or web address]  [address] [place]  [address]   [place] [address]    [phone]    [facility]   [address],  [place], [place]  [phone]   [facility]  [address] [place]  [phone]  [facility]  [address] [place], [place]        [phone]  Open Access/Walk In Clinic under & uninsured  [facility]  [address] [place], [place] [phone] Crisis [phone]  Family Service of the [address],  (Spanish)   [address] , [place]: [phone]) 8:30 - 12; 1 - 2:30  Family Service of the [employer],  [address] [place], [place]    [phone]):8:30 - 12; 2 - 3PM  [facility] [employer],  [address]  [place]; [phone]):   Mon - Fri 8 AM - 5 PM  [facility] [address] [place]  MWF 12:30 to 3:00 or call to schedule an appointment  [phone]  Specific Provider options Psychology Today  [email or web address]/us  click on find a therapist  enter your zip code left side and select or tailor a therapist for your specific need.   [facility] Provider Directory [email or web address]  (Medicaid)   Follow all drop down to find a provider  Social Support program Mental Health [place] [phone] or photosolver.pl [address], [place], [place] Recovery  support and educational   24- Hour Availability:   [facility]  [address] [place], [place] [phone] Crisis [phone]  Family Service of the [facility] [phone]  [place] Crisis Service  [phone]   [facility]  [phone] (after hours)  Therapeutic Alternative/Mobile Crisis   [phone]  [place]  National Suicide  Hotline  (559)406-6029 MERRILYN)  Call 911 or go to emergency room  [facility]  [phone]);  [place] and [place]  [phone]); [place], [place], [place], [place], [place], [place], [place]   Take a capful of miralax  a day and senna one tab a day. Monitor bowel movements as well. Return in 1 month   If you haven't already, sign up for My Chart to have easy access to your labs results, and communication with your primary care physician.   Please arrive 15 minutes before your appointment to ensure smooth check in process.  We appreciate your efforts in making this happen.  Thank you for allowing me to participate in your care, Laurier Hugger, MD 04/14/2023, 7:33 AM PGY-3, [facility] Family Medicine

## 2023-04-14 NOTE — Assessment & Plan Note (Signed)
 Provided with the number for [facility] at [facility]  Therapy numbers provided  Suicide Hotline provided  Patient without SI/HI; denies active plan and reports that they are safe to continue with outpatient treatment.  Close follow up 1 month  RN VBCI order placed to assist with appts and scheduling at home

## 2023-04-21 ENCOUNTER — Other Ambulatory Visit: Payer: Self-pay | Admitting: Obstetrics and Gynecology

## 2023-04-21 NOTE — Patient Outreach (Signed)
RNCM called patient at scheduled time.  RNCM confirmed with patient that she did receive resources LCSW emailed to patient last month.  Patient also received Tailored Plan information emailed to patient.  Patient has [employer] Tailored Plan effective 01/09/23.  Patient has case Production designer, theatre/television/film at [facility] and has contact information.  Provided patient with RNCM contact information.  Kathi Der RN, BSN, [employer] [facility] RN Care Manager Direct Dial [phone]/BMW [phone] Website: [email or web address]

## 2023-06-16 ENCOUNTER — Ambulatory Visit (HOSPITAL_COMMUNITY): Payer: MEDICAID | Admitting: Physician Assistant

## 2023-06-17 ENCOUNTER — Ambulatory Visit (HOSPITAL_COMMUNITY): Payer: Self-pay | Admitting: Psychiatry

## 2023-07-07 ENCOUNTER — Ambulatory Visit (HOSPITAL_COMMUNITY): Payer: MEDICAID | Admitting: Mental Health

## 2023-07-17 ENCOUNTER — Ambulatory Visit: Payer: MEDICAID | Admitting: Family Medicine

## 2023-07-17 NOTE — Progress Notes (Unsigned)
    SUBJECTIVE:   CHIEF COMPLAINT / HPI:   ***  STI overview  RFs: younger age, unmarried, MSM, new partner in past 2 mo, multiple partners, hx prior STI, HIV+, nonmedical drug use, imprisoned, contact w sex workers, hx sex work, inconsistent condom use GC/Chlamydia- sexually active female, <25y, MSM annually at site(s) of contact, q3-76mo if inc risk Syphilis- MSM annually, q3-6 mo if inc risk HIV- all adults 13-64 once, MSM annually if pt/partner had other partners History: 5Ps- partners (how many since last visit, has your partner had other partners), sexual practices, pregnancy prevention, protection, prior STIs DDx Urethritis- GC or chlamydia > M genitalium > T vaginalis; possibly HSV Cervicitis- GC or chlamydia >> T vaginalis or BV Epididymitis- age <35: GC or chlamydia > E coli; >35- more likely non STD Ulcer- painless: syphilis, granuloma inguinale; painful: HSV, chancroid Proctitis- GC or chlamydia, syphilis, HSV  STI mngmt  Empiric GC/CT tx for all pt presenting with urethritis and cervicitis and for all women w suspected PID Positive test Partner rx: recent partners should be tested and tx, can go to local health dept. Need to report GC/CT/syphilis/HIV to health dept All pt being considered for STI dx or inc risk HPV vax, HAV/HBV for MSM, PWID, and HIV+; consider HBV for all pt being evaluated for STI. Eval for PrEP Specific infxn info Chlamydia: asx vs dysuria, change or inc in vaginal discharge, sx of PID, urethral discharge, testicular pain. Doxy 100mg  BID x7 d or Azithro 1g PO x1. Tx partners from past 60 days Gonorrhea: asx vs similar to chlamydia. CTX 500mg  IM x1 (and doxy 100mg  PO BID x7 day if CT not excluded). Tx partners from past 60 days. Trich: asx vs itching, burning, dysuria, vaginal discharge, penile itching/irritation, burning after ejaculation. MNZ 2g PO x1 or MNZ 500mg  BID x 7 d Syphilis: direct anti treponemal test (EIA or CIA- FN early in dz, + for life,  can't dx reinfection) for pt w hx of syphilis, confirmatory tests (FTA-ABS, TP-EIA, TPPA), indirect/nontreponemal (RPR, VRDL- can have FP in other infxn, preg, FN early or immunocompromised. Tx: 1, 2, or early latent- benzathine PCN 2.4mil IM x1 vs doxy 100mg  PO BID x14 d or azithro 2g PO x1 (less effective); late latent or 3- need specialist, PCN as above IM weekly x 3 wks or doxy x4 wks   Bipolar 1 -not on meds currently?  PERTINENT  PMH / PSH: ***  OBJECTIVE:   There were no vitals taken for this visit.  ***  ASSESSMENT/PLAN:   Assessment & Plan Missed menses  Possible exposure to STD      Naida Austria, MD [facility]

## 2023-07-20 ENCOUNTER — Ambulatory Visit: Payer: MEDICAID | Admitting: Family Medicine

## 2023-07-20 NOTE — Progress Notes (Signed)
 Entered in error

## 2023-07-21 ENCOUNTER — Ambulatory Visit (INDEPENDENT_AMBULATORY_CARE_PROVIDER_SITE_OTHER): Payer: MEDICAID | Admitting: Student

## 2023-07-21 ENCOUNTER — Encounter: Payer: Self-pay | Admitting: Student

## 2023-07-21 VITALS — BP 112/99 | HR 91 | Ht 68.0 in | Wt 194.4 lb

## 2023-07-21 DIAGNOSIS — R140 Abdominal distension (gaseous): Secondary | ICD-10-CM | POA: Diagnosis not present

## 2023-07-21 DIAGNOSIS — Z309 Encounter for contraceptive management, unspecified: Secondary | ICD-10-CM

## 2023-07-21 DIAGNOSIS — Z113 Encounter for screening for infections with a predominantly sexual mode of transmission: Secondary | ICD-10-CM

## 2023-07-21 DIAGNOSIS — F319 Bipolar disorder, unspecified: Secondary | ICD-10-CM | POA: Diagnosis not present

## 2023-07-21 LAB — POCT URINE PREGNANCY: Preg Test, Ur: NEGATIVE

## 2023-07-21 MED ORDER — POLYETHYLENE GLYCOL 3350 17 GM/SCOOP PO POWD: 17.0000 g | Freq: Every day | ORAL | 0 refills | Status: AC

## 2023-07-21 MED ORDER — MEDROXYPROGESTERONE ACETATE 150 MG/ML IM SUSP
150.0000 mg | Freq: Once | INTRAMUSCULAR | Status: AC
  Administered 2023-07-21: 150 mg via INTRAMUSCULAR

## 2023-07-21 NOTE — Assessment & Plan Note (Signed)
 Needs number for [facility] as she previously missed an appt with them, no meds currently. Provided number.

## 2023-07-21 NOTE — Patient Instructions (Addendum)
 It was great to see you today! Thank you for choosing [facility] for your primary care.  Today we addressed: Continue with Miralax  daily in 8 ounces of water  - one dcoopful We will check labs today Also, followup in 1-2 months   If you haven't already, sign up for My Chart to have easy access to your labs results, and communication with your primary care physician.   Please arrive 15 minutes before your appointment to ensure smooth check in process.  We appreciate your efforts in making this happen.  Thank you for allowing me to participate in your care, Ernestina Headland, MD 07/21/2023, 10:26 AM PGY-3, [facility]

## 2023-07-21 NOTE — Progress Notes (Signed)
    SUBJECTIVE:   CHIEF COMPLAINT / HPI:   Bloating Sensation:  - Depo previously  - 6 lb down from last visit  - Mag citrate trial-ed, some benefit  - LMP 06/2023  She experiences bloating and constipation with infrequent and difficult bowel movements. Despite daily attempts, she finds it challenging to pass stool, sometimes needing manual assistance. Large stools cause bleeding due to stretching. There is no diarrhea, vomiting, or abdominal pain.  She has been prescribed Miralax  but has not taken it yet. She uses magnesium  citrate, which provides some relief but does not fully resolve her symptoms.  Her last menstrual period was in April, and she is not currently on birth control, although she received a Depo shot last year. She has been trying to contact her provider for another shot but has been unable to reach. In 2023, she experienced breast tenderness and discharge, suggestive of possible hormonal imbalance, but this has not recurred. She also reports hair growth issues, with the back part of her hair not growing for years.   PERTINENT  PMH / PSH:  ADHD  Allergies  Obesity  Prediabetes  SCFE  OBJECTIVE:   BP (!) 112/99   Pulse 91   Ht 5\' 8"  (1.727 m)   Wt 194 lb 6.4 oz (88.2 kg)   SpO2 99%   BMI 29.56 kg/m   General: Alert and oriented in no apparent distress Heart: Regular rate and rhythm with no murmurs appreciated Lungs: CTA bilaterally, no wheezing Abdomen: Bowel sounds present, no abdominal pain Skin: Warm and dry Psych:    ASSESSMENT/PLAN:   Assessment & Plan Bloating Chronic constipation with straining and bloating. Inconsistent Miralax  use. Keep an eye on weight, need more points to assess trend. Abdomen soft and nonacute. IBD-C? Check for other possible causes including TSH, CMP, CBC. Difficulty with hair growth noted, no patches of hair loss.  - Order thyroid function and electrolyte panels, including sodium and potassium. - Prescribe daily Miralax   with eight ounces of water . - Reorder Miralax  prescription. - Stool diary and return in 1 month  Encounter for contraceptive management, unspecified type Upreg negative, depo given  Bipolar 1 disorder (HCC) Needs number for [facility] as she previously missed an appt with them, no meds currently. Provided number.      Ernestina Headland, MD [facility]

## 2023-07-22 ENCOUNTER — Ambulatory Visit: Payer: Self-pay | Admitting: Student

## 2023-07-24 LAB — CBC WITH DIFFERENTIAL/PLATELET
Basophils Absolute: 0.1 10*3/uL (ref 0.0–0.2)
Basos: 1 %
EOS (ABSOLUTE): 0.2 10*3/uL (ref 0.0–0.4)
Eos: 4 %
Hematocrit: 42 % (ref 34.0–46.6)
Hemoglobin: 13.4 g/dL (ref 11.1–15.9)
Immature Grans (Abs): 0 10*3/uL (ref 0.0–0.1)
Immature Granulocytes: 0 %
Lymphocytes Absolute: 2.1 10*3/uL (ref 0.7–3.1)
Lymphs: 33 %
MCH: 28.2 pg (ref 26.6–33.0)
MCHC: 31.9 g/dL (ref 31.5–35.7)
MCV: 88 fL (ref 79–97)
Monocytes Absolute: 0.4 10*3/uL (ref 0.1–0.9)
Monocytes: 6 %
Neutrophils Absolute: 3.5 10*3/uL (ref 1.4–7.0)
Neutrophils: 56 %
Platelets: 327 10*3/uL (ref 150–450)
RBC: 4.76 x10E6/uL (ref 3.77–5.28)
RDW: 13.1 % (ref 11.7–15.4)
WBC: 6.2 10*3/uL (ref 3.4–10.8)

## 2023-07-24 LAB — COMPREHENSIVE METABOLIC PANEL WITH GFR
ALT: 12 IU/L (ref 0–32)
AST: 12 IU/L (ref 0–40)
Albumin: 4.9 g/dL (ref 4.0–5.0)
Alkaline Phosphatase: 78 IU/L (ref 44–121)
BUN/Creatinine Ratio: 10 (ref 9–23)
BUN: 8 mg/dL (ref 6–20)
Bilirubin Total: 0.4 mg/dL (ref 0.0–1.2)
CO2: 18 mmol/L — ABNORMAL LOW (ref 20–29)
Calcium: 9.7 mg/dL (ref 8.7–10.2)
Chloride: 106 mmol/L (ref 96–106)
Creatinine, Ser: 0.8 mg/dL (ref 0.57–1.00)
Globulin, Total: 2.6 g/dL (ref 1.5–4.5)
Glucose: 80 mg/dL (ref 70–99)
Potassium: 4.1 mmol/L (ref 3.5–5.2)
Sodium: 140 mmol/L (ref 134–144)
Total Protein: 7.5 g/dL (ref 6.0–8.5)
eGFR: 106 mL/min/{1.73_m2} (ref >59)

## 2023-07-24 LAB — CELIAC DISEASE COMPREHENSIVE PANEL WITH REFLEXES: IgA/Immunoglobulin A, Serum: 165 mg/dL (ref 87–352)

## 2023-07-24 LAB — TSH RFX ON ABNORMAL TO FREE T4: TSH: 1.67 u[IU]/mL (ref 0.450–4.500)

## 2023-08-18 ENCOUNTER — Encounter: Payer: Self-pay | Admitting: *Deleted
